# Patient Record
Sex: Female | Born: 1955
Health system: Southern US, Community
[De-identification: ages and names within clinical notes are randomized; demographics above are authoritative.]

## PROBLEM LIST (undated history)

## (undated) DIAGNOSIS — I2510 Atherosclerotic heart disease of native coronary artery without angina pectoris: Secondary | ICD-10-CM

## (undated) DIAGNOSIS — E785 Hyperlipidemia, unspecified: Secondary | ICD-10-CM

## (undated) DIAGNOSIS — I214 Non-ST elevation (NSTEMI) myocardial infarction: Secondary | ICD-10-CM

## (undated) DIAGNOSIS — Z9989 Dependence on other enabling machines and devices: Secondary | ICD-10-CM

## (undated) DIAGNOSIS — Z9981 Dependence on supplemental oxygen: Secondary | ICD-10-CM

## (undated) DIAGNOSIS — I10 Essential (primary) hypertension: Secondary | ICD-10-CM

## (undated) DIAGNOSIS — R569 Unspecified convulsions: Secondary | ICD-10-CM

## (undated) DIAGNOSIS — I272 Pulmonary hypertension, unspecified: Secondary | ICD-10-CM

## (undated) DIAGNOSIS — G4733 Obstructive sleep apnea (adult) (pediatric): Secondary | ICD-10-CM

## (undated) DIAGNOSIS — J449 Chronic obstructive pulmonary disease, unspecified: Secondary | ICD-10-CM

## (undated) DIAGNOSIS — I509 Heart failure, unspecified: Secondary | ICD-10-CM

## (undated) DIAGNOSIS — I639 Cerebral infarction, unspecified: Secondary | ICD-10-CM

## (undated) HISTORY — DX: Unspecified convulsions: R56.9

## (undated) HISTORY — DX: Dependence on other enabling machines and devices: Z99.89

## (undated) HISTORY — DX: Dependence on supplemental oxygen: Z99.81

## (undated) HISTORY — PX: CARDIAC CATHETERIZATION: SHX172

## (undated) HISTORY — DX: Heart failure, unspecified: I50.9

## (undated) HISTORY — DX: Hyperlipidemia, unspecified: E78.5

## (undated) HISTORY — PX: TUBAL LIGATION: SHX77

## (undated) HISTORY — DX: Non-ST elevation (NSTEMI) myocardial infarction: I21.4

## (undated) HISTORY — DX: Cerebral infarction, unspecified: I63.9

## (undated) HISTORY — DX: Chronic obstructive pulmonary disease, unspecified: J44.9

## (undated) HISTORY — DX: Atherosclerotic heart disease of native coronary artery without angina pectoris: I25.10

## (undated) HISTORY — DX: Obstructive sleep apnea (adult) (pediatric): G47.33

## (undated) HISTORY — DX: Pulmonary hypertension, unspecified: I27.20

## (undated) HISTORY — DX: Essential (primary) hypertension: I10

## (undated) HISTORY — PX: MASTECTOMY: SHX3

---

## 2019-09-28 DIAGNOSIS — I214 Non-ST elevation (NSTEMI) myocardial infarction: Secondary | ICD-10-CM | POA: Insufficient documentation

## 2019-09-28 DIAGNOSIS — R4182 Altered mental status, unspecified: Secondary | ICD-10-CM | POA: Insufficient documentation

## 2019-09-28 DIAGNOSIS — I161 Hypertensive emergency: Secondary | ICD-10-CM | POA: Insufficient documentation

## 2019-09-28 DIAGNOSIS — R569 Unspecified convulsions: Secondary | ICD-10-CM | POA: Insufficient documentation

## 2019-12-03 ENCOUNTER — Ambulatory Visit: Payer: Self-pay | Attending: Internal Medicine

## 2019-12-03 DIAGNOSIS — Z23 Encounter for immunization: Secondary | ICD-10-CM

## 2019-12-03 NOTE — Progress Notes (Signed)
   Covid-19 Vaccination Clinic  Name:  Laurie Wall    MRN: [id number] DOB: [birth year]  12/03/2019  Laurie Wall was observed post Covid-19 immunization for 15 minutes without incident. She was provided with Vaccine Information Sheet and instruction to access the V-Safe system.   Laurie Wall was instructed to call 911 with any severe reactions post vaccine: Marland Kitchen Difficulty breathing  . Swelling of face and throat  . A fast heartbeat  . A bad rash all over body  . Dizziness and weakness   Immunizations Administered    Name Date Dose VIS Date Route   Pfizer COVID-19 Vaccine 12/03/2019  1:12 PM 0.3 mL 08/17/2019 Intramuscular   Manufacturer: [employer], [employer]   Lot: [id number]   NDC: [address]-3

## 2019-12-26 ENCOUNTER — Ambulatory Visit: Payer: Self-pay | Attending: Internal Medicine

## 2019-12-26 DIAGNOSIS — Z23 Encounter for immunization: Secondary | ICD-10-CM

## 2019-12-26 NOTE — Progress Notes (Signed)
   Covid-19 Vaccination Clinic  Name:  Laurie Wall    MRN: [id number] DOB: 01-Oct-1955  12/26/2019  [name] was observed post Covid-19 immunization for 15 minutes without incident. She was provided with Vaccine Information Sheet and instruction to access the V-Safe system.   [name] was instructed to call 911 with any severe reactions post vaccine: Marland Kitchen Difficulty breathing  . Swelling of face and throat  . A fast heartbeat  . A bad rash all over body  . Dizziness and weakness   Immunizations Administered    Name Date Dose VIS Date Route   Pfizer COVID-19 Vaccine 12/26/2019 10:02 AM 0.3 mL 10/31/2018 Intramuscular   Manufacturer: [employer], [employer]   Lot: [id number]   NDC: [address]-0

## 2020-11-18 ENCOUNTER — Other Ambulatory Visit: Payer: Self-pay

## 2020-11-18 ENCOUNTER — Ambulatory Visit: Payer: Medicaid Other | Admitting: Cardiology

## 2020-11-18 ENCOUNTER — Encounter: Payer: Self-pay | Admitting: Cardiology

## 2020-11-18 VITALS — BP 165/64 | HR 58 | Temp 98.4°F | Resp 16 | Ht 62.0 in | Wt 255.0 lb

## 2020-11-18 DIAGNOSIS — Z8673 Personal history of transient ischemic attack (TIA), and cerebral infarction without residual deficits: Secondary | ICD-10-CM

## 2020-11-18 DIAGNOSIS — I10 Essential (primary) hypertension: Secondary | ICD-10-CM

## 2020-11-18 DIAGNOSIS — G4733 Obstructive sleep apnea (adult) (pediatric): Secondary | ICD-10-CM

## 2020-11-18 DIAGNOSIS — J449 Chronic obstructive pulmonary disease, unspecified: Secondary | ICD-10-CM

## 2020-11-18 DIAGNOSIS — I252 Old myocardial infarction: Secondary | ICD-10-CM

## 2020-11-18 DIAGNOSIS — E782 Mixed hyperlipidemia: Secondary | ICD-10-CM

## 2020-11-18 DIAGNOSIS — Z9981 Dependence on supplemental oxygen: Secondary | ICD-10-CM

## 2020-11-18 DIAGNOSIS — I5032 Chronic diastolic (congestive) heart failure: Secondary | ICD-10-CM

## 2020-11-18 DIAGNOSIS — Z8249 Family history of ischemic heart disease and other diseases of the circulatory system: Secondary | ICD-10-CM

## 2020-11-18 DIAGNOSIS — I2510 Atherosclerotic heart disease of native coronary artery without angina pectoris: Secondary | ICD-10-CM

## 2020-11-18 NOTE — Progress Notes (Signed)
Date:  11/18/2020   ID:  Laurie Wall, DOB 04/10/1956, MRN [id number]  PCP: Wenda Low, MD  Cardiologist:  Rex Kras, DO, [facility] (established care 11/18/2020) Former Cardiology Providers: Caren Hazy MD, Dr. Elenore Paddy    REASON FOR CONSULT: Non-ST elevation (NSTEMI) myocardial infarction, CAD, [place]  REQUESTING PHYSICIAN:  Noralee Stain, FNP [address] [place],  [place] [address]  Chief Complaint  Patient presents with  . New Patient (Initial Visit)  . Establish Care    History of coronary artery disease, stroke, pulmonary hypertension    HPI  Laurie Wall is a 65 y.o. female who presents to the office with a chief complaint of " reestablish care with cardiology." Patient's past medical history and cardiovascular risk factors include: History of non-STEMI, history of stroke, heart failure with preserved EF, three-vessel coronary artery disease, hyperlipidemia, hypertension, OSA on CPAP, pulmonary hypertension, COPD with oxygen dependence 2 L, postmenopausal female, advanced age, obesity.  She is referred to the office at the request of Marvis Repress, FNP to reestablish care with cardiology after moving to [facility] from [place], [place] given her history of non-STEMI, CAD, stroke, pulmonary hypertension.  Patient is accompanied by her sister [name] who can be reached at [phone].  Patient provides verbal consent in regards to discussing her medical information in her presence.  Patient used to live in [facility] and is now currently in the transition of moving to [place] to be closer to family and is referred to cardiology for reestablishing care given her complex past medical history as outlined above.  It seems of the patient had a stroke in January 2021 and in route to the hospital she had a seizure.  While she was hospitalized at [facility] she was diagnosed with non-STEMI and underwent a coronary catheterization and was found to have  multivessel CAD.  Due to the recent stroke that shared decision at that time was to treat her medically with close follow-up under the care of Dr. Aurea Graff.  Currently patient denies any chest pain or shortness of breath at rest or with effort related activities.  She recently had blood work done at her PCPs office which were reviewed via care everywhere and noted below for further reference.  Patient's blood pressure at today's office visit is not well controlled.  However, she states that her home blood pressures are usually between 120-130 mmHg.  She recently had gone to her primary care doctor and was noted to have a low heart rate.  As per Care Everywhere EKG noted to have ventricular rate of 49 bpm.  Therefore, the dose of her carvedilol was reduced to 6.25 mg p.o. twice daily she was asked to follow-up with cardiology for further recommendations.  Family history of premature coronary disease but no sudden cardiac death.  Brother had a myocardial infarction at the age of 68.  FUNCTIONAL STATUS: No structured exercise program or daily routine.   ALLERGIES: No Known Allergies  MEDICATION LIST PRIOR TO VISIT: Current Meds  Medication Sig  . amLODipine (NORVASC) 10 MG tablet Take 10 mg by mouth daily.  Marland Kitchen aspirin 81 MG chewable tablet 1 tablet  . atorvastatin (LIPITOR) 80 MG tablet Take 1 tablet by mouth daily.  . carvedilol (COREG) 6.25 MG tablet Take 1 tablet by mouth 2 (two) times daily with a meal.  . chlorthalidone (HYGROTON) 25 MG tablet TAKE 1/2 TABLET BY MOUTH EVERY DAY IN THE MORNING WITH FOOD  . Cholecalciferol 25 MCG (1000  UT) tablet Take by mouth.  . cyanocobalamin 100 MCG tablet Take by mouth.  . Ferrous Sulfate (IRON) 325 (65 Fe) MG TABS 1 tablet  . Fluticasone-Umeclidin-Vilant (TRELEGY ELLIPTA) 100-62.5-25 MCG/INH AEPB 1 puff  . furosemide (LASIX) 20 MG tablet Take 20 mg by mouth daily.  Marland Kitchen gabapentin (NEURONTIN) 100 MG capsule 2 at 7 pm  . levETIRAcetam (KEPPRA) 1000 MG  tablet 1 q am and 1/2 q pm  . omeprazole (PRILOSEC) 20 MG capsule 1 capsule  . Pyridoxine HCl (VITAMIN B6) 100 MG TABS 1 tablet     PAST MEDICAL HISTORY: Past Medical History:  Diagnosis Date  . CHF (congestive heart failure) ([place])   . COPD (chronic obstructive pulmonary disease) ([place])   . Coronary artery disease   . Hyperlipidemia   . Hypertension   . NSTEMI (non-ST elevated myocardial infarction) ([place])   . OSA on CPAP   . Oxygen dependent   . Pulmonary hypertension ([place])   . Seizure ([place])   . Stroke [facility])     PAST SURGICAL HISTORY: Past Surgical History:  Procedure Laterality Date  . CARDIAC CATHETERIZATION    . MASTECTOMY Bilateral   . TUBAL LIGATION      FAMILY HISTORY: The patient family history includes Asthma in her sister; Heart attack in her father and mother; Hyperlipidemia in her sister and sister; Hypertension in her brother.  SOCIAL HISTORY:  The patient  reports that she quit smoking about 13 months ago. Her smoking use included cigarettes. She has a 30.00 pack-year smoking history. She has never used smokeless tobacco. She reports that she does not drink alcohol and does not use drugs.  REVIEW OF SYSTEMS: Review of Systems  Constitutional: Negative for chills and fever.  HENT: Negative for hoarse voice and nosebleeds.   Eyes: Negative for discharge, double vision and pain.  Cardiovascular: Positive for leg swelling and orthopnea. Negative for chest pain, claudication, dyspnea on exertion, near-syncope, palpitations, paroxysmal nocturnal dyspnea and syncope.  Respiratory: Negative for hemoptysis and shortness of breath.   Musculoskeletal: Negative for muscle cramps and myalgias.  Gastrointestinal: Negative for abdominal pain, constipation, diarrhea, hematemesis, hematochezia, melena, nausea and vomiting.  Neurological: Positive for seizures. Negative for dizziness and light-headedness.       Speaking difficulties (expressive aphasia)    PHYSICAL  EXAM: Vitals with BMI 11/18/2020  Height 5' 2"   Weight 255 lbs  BMI 01.77  Systolic 939  Diastolic 64  Pulse 58    CONSTITUTIONAL: Appears older than stated age, hemodynamically stable, no acute distress.    SKIN: Skin is warm and dry. No rash noted. No cyanosis. No pallor. No jaundice HEAD: Normocephalic and atraumatic.  EYES: No scleral icterus MOUTH/THROAT: Moist oral membranes.  NECK: No JVD present. No thyromegaly noted. No carotid bruits  LYMPHATIC: No visible cervical adenopathy.  CHEST Normal respiratory effort. No intercostal retractions  LUNGS: Clear to auscultation bilaterally.  No stridor. No wheezes. No rales.  CARDIOVASCULAR: Regular, positive S1-S2, no murmurs rubs or gallops appreciated. ABDOMINAL: Obese, soft, nontender, nondistended, positive bowel sounds in all 4 quadrants, no apparent ascites.  EXTREMITIES: Trace bilateral peripheral edema, darker skin pigmentation to suggest chronic venous insufficiency, decreased bilateral DP and PT pulses. HEMATOLOGIC: No significant bruising NEUROLOGIC: Oriented to person, place, and time. Nonfocal. Normal muscle tone.  PSYCHIATRIC: Normal mood and affect. Normal behavior. Cooperative  CARDIAC DATABASE: EKG: 11/18/2020: Sinus bradycardia, 52 bpm, normal axis, old anteroseptal infarct, ST-T changes in inferolateral leads suggestive of possible ischemia, without underlying injury pattern.  Echocardiogram: [facility] health care 04/25/2020:  1. The left ventricle is normal in size with normal wall thickness.  2. The left ventricular systolic function is normal, LVEF is visually estimated at 55-60%.  3. There is grade II diastolic dysfunction (elevated filling pressure).  4. The left atrium is mildly dilated in size.  5. The right ventricle is normal in size, with normal systolic function.  6. There is moderate pulmonary hypertension, estimated pulmonary artery systolic pressure is 56 mmHg.  7. The right atrium is mildly  dilated in size.   Stress Testing: No results found for this or any previous visit from the past 1095 days.  Heart Catheterization: [facility] 10/04/2019: 1. Normal LV filling pressures with mean PCWP of 8 mm Hg and LVEDP of 11 mm Hg  2. Normal LV systolic function  3. No significant renal artery stenosis  4. Multivessel coronary artery disease including 80% proximal LADstenosis, 60% mid-circumflex stenosis, 50% OM1 stenosis, 60% OM2 stenosis and 95% mid-RCA stenosis  5. Pulmonary hypertension with PA pressure of 56/19 mm Hg with a mean of 32 mm Hg   LABORATORY DATA: No flowsheet data found.  No flowsheet data found.  Lipid Panel  No results found for: CHOL, TRIG, HDL, CHOLHDL, VLDL, LDLCALC, LDLDIRECT, LABVLDL  No components found for: NTPROBNP No results for input(s): PROBNP in the last 8760 hours. No results for input(s): TSH in the last 8760 hours.  BMP No results for input(s): NA, K, CL, CO2, GLUCOSE, BUN, CREATININE, CALCIUM, GFRNONAA, GFRAA in the last 8760 hours.  HEMOGLOBIN A1C No results found for: HGBA1C, MPG  External Labs: Collected: 11/13/2020 provided from care everywhere [facility] clinic) Hemoglobin 12.9 g/dL, hematocrit 40.5% NT proBNP: 194 Creatinine 1 mg/dL. eGFR: 62 mL/min per 1.73 m Sodium 141, potassium 3.5, chloride 97, bicarb 35, AST 16, ALT 18, alkaline phosphatase 127 Lipid profile: Total cholesterol 154, triglycerides 75, HDL 57, LDL 82.  Hemoglobin A1c: 6.1 TSH: 1.53  IMPRESSION:    ICD-10-CM   1. Atherosclerosis of native coronary artery of native heart without angina pectoris  I25.10   2. History of non-ST elevation myocardial infarction (NSTEMI)  I25.2 EKG 12-Lead  3. Chronic heart failure with preserved ejection fraction (HCC)  I50.32   4. History of stroke  Z86.73   5. Benign hypertension  I10   6. Mixed hyperlipidemia  E78.2   7. OSA on CPAP  G47.33    Z99.89   8. Chronic obstructive pulmonary disease, unspecified COPD  type ([place])  J44.9   9. Oxygen dependent  Z99.81   10. Family history of premature CAD  Z82.49   63. Class 3 severe obesity due to excess calories with serious comorbidity and body mass index (BMI) of 45.0 to 49.9 in adult [facility])  E66.01    Z68.42      RECOMMENDATIONS: Aziza Stuckert is a 66 y.o. female whose past medical history and cardiac risk factors include: History of non-STEMI, history of stroke, heart failure with preserved EF, three-vessel coronary artery disease, hyperlipidemia, hypertension, OSA on CPAP, pulmonary hypertension, COPD with oxygen dependence 2 L, postmenopausal female, advanced age, obesity.  Establish coronary artery disease without angina pectoris: During her hospitalization in January 2021 patient was diagnosed with non-STEMI as well as HFpEF.  Due to the recent stroke patient's sister states that the shared decision was to treat her medically with close follow-up. Patient remains asymptomatic. Was unable to reconcile her medications accurately as she did not bring a list of her medications  or her medication bottles at today's office visit. Patient is educated on the importance of improving her modifiable cardiovascular risk factors. Reviewed the most recent echocardiogram and left heart catheterization reports that are available in Care Everywhere during today's office encounter and findings summarized above. Recommended continuing the current dose of carvedilol.  History of non-STEMI: Continue aspirin and statin therapy.  Continue carvedilol 6.25 mg p.o. twice daily.  Chronic heart failure with preserved EF, stage C, NYHA class II: Overall euvolemic. Most recent NT proBNP within normal limits. Once her medications are better reconcile we will consider discontinuing chlorthalidone and/or Lasix and transitioning her to [facility] given her HFpEF, and structural heart disease noted on the most recent echocardiogram. Further recommendations to follow  Benign  essential hypertension: Office blood pressures not well controlled. However, patient states that her home blood pressures are well controlled. I have asked her to keep a log of her blood pressures and to bring it in at the next office visit. Low-salt diet recommended, discussed DASH diet. Encourage medication compliance.  Hyperlipidemia: Continue statin therapy.  Most recent lipid profile independently reviewed and findings noted above.  History of stroke: Patient has developed slurred speech status post stroke. Educated on the importance of secondary prevention. Continue aspirin and statin therapy. Most recent hemoglobin A1c consistent with prediabetes.  Patient educated on the importance of glycemic control. Most recent lipid profile reviewed. Encouraged her regarding the importance of blood pressure monitoring and medication compliance.  COPD on 2 L nasal cannula: I have asked the patient to consider seeing pulmonary medicine to reestablish care given her underlying COPD and sleep apnea.  Will defer further management to primary/pulmonary medicine.   FINAL MEDICATION LIST END OF ENCOUNTER: No orders of the defined types were placed in this encounter.   There are no discontinued medications.   Current Outpatient Medications:  .  amLODipine (NORVASC) 10 MG tablet, Take 10 mg by mouth daily., Disp: , Rfl:  .  aspirin 81 MG chewable tablet, 1 tablet, Disp: , Rfl:  .  atorvastatin (LIPITOR) 80 MG tablet, Take 1 tablet by mouth daily., Disp: , Rfl:  .  carvedilol (COREG) 6.25 MG tablet, Take 1 tablet by mouth 2 (two) times daily with a meal., Disp: , Rfl:  .  chlorthalidone (HYGROTON) 25 MG tablet, TAKE 1/2 TABLET BY MOUTH EVERY DAY IN THE MORNING WITH FOOD, Disp: , Rfl:  .  Cholecalciferol 25 MCG (1000 UT) tablet, Take by mouth., Disp: , Rfl:  .  cyanocobalamin 100 MCG tablet, Take by mouth., Disp: , Rfl:  .  Ferrous Sulfate (IRON) 325 (65 Fe) MG TABS, 1 tablet, Disp: , Rfl:  .   Fluticasone-Umeclidin-Vilant (TRELEGY ELLIPTA) 100-62.5-25 MCG/INH AEPB, 1 puff, Disp: , Rfl:  .  furosemide (LASIX) 20 MG tablet, Take 20 mg by mouth daily., Disp: , Rfl:  .  gabapentin (NEURONTIN) 100 MG capsule, 2 at 7 pm, Disp: , Rfl:  .  levETIRAcetam (KEPPRA) 1000 MG tablet, 1 q am and 1/2 q pm, Disp: , Rfl:  .  omeprazole (PRILOSEC) 20 MG capsule, 1 capsule, Disp: , Rfl:  .  Pyridoxine HCl (VITAMIN B6) 100 MG TABS, 1 tablet, Disp: , Rfl:   Orders Placed This Encounter  Procedures  . EKG 12-Lead    There are no Patient Instructions on file for this visit.   --Continue cardiac medications as reconciled in final medication list. --Return in about 4 weeks (around 12/16/2020) for Follow up, CAD. Or sooner if needed. --Continue follow-up  with your primary care physician regarding the management of your other chronic comorbid conditions.  Patient's questions and concerns were addressed to her satisfaction. She voices understanding of the instructions provided during this encounter.   This note was created using a voice recognition software as a result there may be grammatical errors inadvertently enclosed that do not reflect the nature of this encounter. Every attempt is made to correct such errors.  Total encounter time 72 minutes. *Total Encounter Time as defined by the Centers for Medicare and Medicaid Services includes, in addition to the face-to-face time of a patient visit (documented in the note above) non-face-to-face time: obtaining and reviewing outside history, ordering and reviewing medications, tests or procedures, obtaining history of present illness from her sister, care coordination (communications with other health care professionals or caregivers) and documentation in the medical record.   Rex Kras, [place], [facility]  Pager: [phone] Office: [phone]

## 2020-12-03 ENCOUNTER — Telehealth: Payer: Self-pay

## 2020-12-12 NOTE — Telephone Encounter (Signed)
error 

## 2020-12-23 ENCOUNTER — Other Ambulatory Visit: Payer: Self-pay

## 2020-12-23 ENCOUNTER — Other Ambulatory Visit: Payer: Self-pay | Admitting: Cardiology

## 2020-12-23 ENCOUNTER — Encounter: Payer: Self-pay | Admitting: Cardiology

## 2020-12-23 ENCOUNTER — Ambulatory Visit: Payer: Medicare Other | Admitting: Cardiology

## 2020-12-23 VITALS — BP 149/70 | HR 62 | Temp 97.2°F | Resp 16 | Ht 62.0 in | Wt 260.4 lb

## 2020-12-23 DIAGNOSIS — I10 Essential (primary) hypertension: Secondary | ICD-10-CM

## 2020-12-23 DIAGNOSIS — E782 Mixed hyperlipidemia: Secondary | ICD-10-CM | POA: Diagnosis not present

## 2020-12-23 DIAGNOSIS — I2510 Atherosclerotic heart disease of native coronary artery without angina pectoris: Secondary | ICD-10-CM

## 2020-12-23 DIAGNOSIS — I252 Old myocardial infarction: Secondary | ICD-10-CM

## 2020-12-23 DIAGNOSIS — I5032 Chronic diastolic (congestive) heart failure: Secondary | ICD-10-CM

## 2020-12-23 DIAGNOSIS — Z9989 Dependence on other enabling machines and devices: Secondary | ICD-10-CM | POA: Diagnosis not present

## 2020-12-23 DIAGNOSIS — Z8249 Family history of ischemic heart disease and other diseases of the circulatory system: Secondary | ICD-10-CM | POA: Diagnosis not present

## 2020-12-23 DIAGNOSIS — J449 Chronic obstructive pulmonary disease, unspecified: Secondary | ICD-10-CM

## 2020-12-23 DIAGNOSIS — G4733 Obstructive sleep apnea (adult) (pediatric): Secondary | ICD-10-CM | POA: Diagnosis not present

## 2020-12-23 DIAGNOSIS — Z8673 Personal history of transient ischemic attack (TIA), and cerebral infarction without residual deficits: Secondary | ICD-10-CM

## 2020-12-23 DIAGNOSIS — I272 Pulmonary hypertension, unspecified: Secondary | ICD-10-CM | POA: Diagnosis not present

## 2020-12-23 DIAGNOSIS — Z9981 Dependence on supplemental oxygen: Secondary | ICD-10-CM

## 2020-12-23 MED ORDER — CARVEDILOL 6.25 MG PO TABS: 6.2500 mg | ORAL_TABLET | Freq: Two times a day (BID) | ORAL | 0 refills | Status: DC

## 2020-12-23 MED ORDER — ENTRESTO 24-26 MG PO TABS: 1.0000 | ORAL_TABLET | Freq: Two times a day (BID) | ORAL | 0 refills | Status: DC

## 2020-12-23 MED ORDER — NITROGLYCERIN 0.4 MG SL SUBL: 0.4000 mg | SUBLINGUAL_TABLET | SUBLINGUAL | 0 refills | Status: AC | PRN

## 2020-12-23 NOTE — Progress Notes (Signed)
ID:  Laurie Wall, DOB 30-Sep-1955, MRN [id number]  PCP: Wenda Low, MD  Cardiologist:  Rex Kras, DO, [facility] (established care 11/18/2020) Former Cardiology Providers: Caren Hazy MD, Dr. Elenore Paddy    Date: 12/23/2020 Last Office Visit: 11/18/2020  Chief Complaint  Patient presents with  . Follow-up  . Coronary Artery Disease    HPI  Laurie Wall is a 65 y.o. female who presents to the office with a chief complaint of " management of CAD." Patient's past medical history and cardiovascular risk factors include: History of non-STEMI, history of stroke, heart failure with preserved EF, three-vessel coronary artery disease, hyperlipidemia, hypertension, OSA on CPAP, pulmonary hypertension, COPD with oxygen dependence 2 L, postmenopausal female, advanced age, obesity.  She is referred to the office at the request of Laurie Repress, FNP to reestablish care with cardiology after moving to [facility] from [place], [place] given her history of non-STEMI, CAD, stroke, pulmonary hypertension.  Patient is accompanied by her sister [name] who can be reached at [phone].  Patient provides verbal consent in regards to discussing her medical information in her presence.  Patient used to live in [facility] and is now currently in the transition of moving to [place] to be closer to family and is referred to cardiology for reestablishing care given her complex past medical history as outlined above.  Patient had a stroke in January 2021 and in route to the hospital she had a seizure.  While she was hospitalized at [facility] she was diagnosed with non-STEMI and underwent a coronary catheterization and was found to have multivessel CAD.  Due to the recent stroke that shared decision at that time was to treat her medically with close follow-up under the care of Dr. Aurea Graff.  Since relocated from establish care with our practice as of March 2022.  She denies chest pain or  anginal equivalent.  Medications were titrated last office visit and was asked to follow-up in 1 month to reevaluate symptoms and further medication titration.  After reducing the dose of carvedilol patient states that she is not feeling tired, fatigued.  Her home blood pressures range between 120-140 mmHg and pulse is between 50-60 bpm.  No hospitalizations or urgent care visits for cardiovascular symptoms.  Family history of premature coronary disease but no sudden cardiac death.  Brother had a myocardial infarction at the age of 30.  FUNCTIONAL STATUS: No structured exercise program or daily routine.   ALLERGIES: No Known Allergies  MEDICATION LIST PRIOR TO VISIT: Current Meds  Medication Sig  . amLODipine (NORVASC) 10 MG tablet Take 10 mg by mouth daily.  Marland Kitchen aspirin 81 MG chewable tablet 1 tablet  . atorvastatin (LIPITOR) 80 MG tablet Take 1 tablet by mouth daily.  . Cholecalciferol 25 MCG (1000 UT) tablet Take by mouth.  . cyanocobalamin 100 MCG tablet Take by mouth.  . Ferrous Sulfate (IRON) 325 (65 Fe) MG TABS 1 tablet  . Fluticasone-Umeclidin-Vilant (TRELEGY ELLIPTA) 100-62.5-25 MCG/INH AEPB 1 puff  . gabapentin (NEURONTIN) 100 MG capsule 2 at 7 pm  . levETIRAcetam (KEPPRA) 1000 MG tablet 1 q am and 1/2 q pm  . nitroGLYCERIN (NITROSTAT) 0.4 MG SL tablet Place 1 tablet (0.4 mg total) under the tongue every 5 (five) minutes as needed for chest pain. If you require more than two tablets five minutes apart go to the nearest ER via EMS.  Marland Kitchen omeprazole (PRILOSEC) 20 MG capsule 1 capsule  . Pyridoxine HCl (VITAMIN B6) 100 MG TABS 1  tablet  . sacubitril-valsartan (ENTRESTO) 24-26 MG Take 1 tablet by mouth 2 (two) times daily.  . [DISCONTINUED] carvedilol (COREG) 6.25 MG tablet Take 1 tablet by mouth 2 (two) times daily with a meal.  . [DISCONTINUED] chlorthalidone (HYGROTON) 25 MG tablet TAKE 1/2 TABLET BY MOUTH EVERY DAY IN THE MORNING WITH FOOD  . [DISCONTINUED] furosemide (LASIX) 20 MG  tablet Take 20 mg by mouth daily.     PAST MEDICAL HISTORY: Past Medical History:  Diagnosis Date  . CHF (congestive heart failure) ([place])   . COPD (chronic obstructive pulmonary disease) ([place])   . Coronary artery disease   . Hyperlipidemia   . Hypertension   . NSTEMI (non-ST elevated myocardial infarction) ([place])   . OSA on CPAP   . Oxygen dependent   . Pulmonary hypertension ([place])   . Seizure ([place])   . Stroke [facility])     PAST SURGICAL HISTORY: Past Surgical History:  Procedure Laterality Date  . CARDIAC CATHETERIZATION    . MASTECTOMY Bilateral   . TUBAL LIGATION      FAMILY HISTORY: The patient family history includes Asthma in her sister; Heart attack in her father and mother; Hyperlipidemia in her sister and sister; Hypertension in her brother.  SOCIAL HISTORY:  The patient  reports that she quit smoking about 15 months ago. Her smoking use included cigarettes. She has a 30.00 pack-year smoking history. She has never used smokeless tobacco. She reports that she does not drink alcohol and does not use drugs.  REVIEW OF SYSTEMS: Review of Systems  Constitutional: Negative for chills and fever.  HENT: Negative for hoarse voice and nosebleeds.   Eyes: Negative for discharge, double vision and pain.  Cardiovascular: Negative for chest pain, claudication, dyspnea on exertion, leg swelling, near-syncope, orthopnea, palpitations, paroxysmal nocturnal dyspnea and syncope.  Respiratory: Negative for hemoptysis and shortness of breath.   Musculoskeletal: Negative for muscle cramps and myalgias.  Gastrointestinal: Negative for abdominal pain, constipation, diarrhea, hematemesis, hematochezia, melena, nausea and vomiting.  Neurological: Positive for seizures. Negative for dizziness and light-headedness.       Speaking difficulties (expressive aphasia)    PHYSICAL EXAM: Vitals with BMI 12/23/2020 11/18/2020  Height 5' 2"  5' 2"   Weight 260 lbs 6 oz 255 lbs  BMI 60.73 71.06   Systolic 269 485  Diastolic 70 64  Pulse 62 58    CONSTITUTIONAL: Appears older than stated age, hemodynamically stable, no acute distress.    SKIN: Skin is warm and dry. No rash noted. No cyanosis. No pallor. No jaundice HEAD: Normocephalic and atraumatic.  EYES: No scleral icterus MOUTH/THROAT: Moist oral membranes.  NECK: No JVD present. No thyromegaly noted. No carotid bruits  LYMPHATIC: No visible cervical adenopathy.  CHEST Normal respiratory effort. No intercostal retractions  LUNGS: Clear to auscultation bilaterally.  No stridor. No wheezes. No rales.  CARDIOVASCULAR: Regular, positive S1-S2, no murmurs rubs or gallops appreciated. ABDOMINAL: Obese, soft, nontender, nondistended, positive bowel sounds in all 4 quadrants, no apparent ascites.  EXTREMITIES: Trace bilateral peripheral edema, darker skin pigmentation to suggest chronic venous insufficiency, decreased bilateral DP and PT pulses. HEMATOLOGIC: No significant bruising NEUROLOGIC: Oriented to person, place, and time. Nonfocal. Normal muscle tone.  Walks with a walker. PSYCHIATRIC: Normal mood and affect. Normal behavior. Cooperative  CARDIAC DATABASE: EKG: 11/18/2020: Sinus bradycardia, 52 bpm, normal axis, old anteroseptal infarct, ST-T changes in inferolateral leads suggestive of possible ischemia, without underlying injury pattern.  Echocardiogram: [facility] care 04/25/2020:  1. The left ventricle is  normal in size with normal wall thickness.  2. The left ventricular systolic function is normal, LVEF is visually estimated at 55-60%.  3. There is grade II diastolic dysfunction (elevated filling pressure).  4. The left atrium is mildly dilated in size.  5. The right ventricle is normal in size, with normal systolic function.  6. There is moderate pulmonary hypertension, estimated pulmonary artery systolic pressure is 56 mmHg.  7. The right atrium is mildly dilated in size.   Stress Testing: No  results found for this or any previous visit from the past 1095 days.  Heart Catheterization: [facility] 10/04/2019: 1. Normal LV filling pressures with mean PCWP of 8 mm Hg and LVEDP of 11 mm Hg  2. Normal LV systolic function  3. No significant renal artery stenosis  4. Multivessel coronary artery disease including 80% proximal LADstenosis, 60% mid-circumflex stenosis, 50% OM1 stenosis, 60% OM2 stenosis and 95% mid-RCA stenosis  5. Pulmonary hypertension with PA pressure of 56/19 mm Hg with a mean of 32 mm Hg   LABORATORY DATA: No flowsheet data found.  No flowsheet data found.  Lipid Panel  No results found for: CHOL, TRIG, HDL, CHOLHDL, VLDL, LDLCALC, LDLDIRECT, LABVLDL  No components found for: NTPROBNP No results for input(s): PROBNP in the last 8760 hours. No results for input(s): TSH in the last 8760 hours.  BMP No results for input(s): NA, K, CL, CO2, GLUCOSE, BUN, CREATININE, CALCIUM, GFRNONAA, GFRAA in the last 8760 hours.  HEMOGLOBIN A1C No results found for: HGBA1C, MPG  External Labs: Collected: 11/13/2020 provided from care everywhere [facility] clinic) Hemoglobin 12.9 g/dL, hematocrit 40.5% NT proBNP: 194 Creatinine 1 mg/dL. eGFR: 62 mL/min per 1.73 m Sodium 141, potassium 3.5, chloride 97, bicarb 35, AST 16, ALT 18, alkaline phosphatase 127 Lipid profile: Total cholesterol 154, triglycerides 75, HDL 57, LDL 82.  Hemoglobin A1c: 6.1 TSH: 1.53  IMPRESSION:    ICD-10-CM   1. Atherosclerosis of native coronary artery of native heart without angina pectoris  I25.10 PCV ECHOCARDIOGRAM COMPLETE    nitroGLYCERIN (NITROSTAT) 0.4 MG SL tablet    DISCONTINUED: carvedilol (COREG) 6.25 MG tablet  2. History of non-ST elevation myocardial infarction (NSTEMI)  I25.2 nitroGLYCERIN (NITROSTAT) 0.4 MG SL tablet  3. Chronic heart failure with preserved ejection fraction (HCC)  I50.32 sacubitril-valsartan (ENTRESTO) 24-26 MG    PCV ECHOCARDIOGRAM COMPLETE    Basic  metabolic panel    Magnesium    Pro b natriuretic peptide (BNP)    DISCONTINUED: carvedilol (COREG) 6.25 MG tablet  4. History of TIA (transient ischemic attack)  Z86.73   5. Benign hypertension  I10 DISCONTINUED: carvedilol (COREG) 6.25 MG tablet  6. Mixed hyperlipidemia  E78.2   7. OSA on CPAP  G47.33    Z99.89   8. Chronic obstructive pulmonary disease, unspecified COPD type ([place])  J44.9   9. Oxygen dependent  Z99.81   10. Family history of premature CAD  Z82.49      RECOMMENDATIONS: Laiken Sandy is a 65 y.o. female whose past medical history and cardiac risk factors include: History of non-STEMI, history of stroke, heart failure with preserved EF, three-vessel coronary artery disease, hyperlipidemia, hypertension, OSA on CPAP, pulmonary hypertension, COPD with oxygen dependence 2 L, postmenopausal female, advanced age, obesity.  Establish coronary artery disease without angina pectoris:  During her hospitalization in January 2021 patient was diagnosed with non-STEMI, multivessel CAD, HFpEF.  Due to the recent stroke patient's sister states that the shared decision was to treat  her medically with close follow-up.  Patient remains asymptomatic.  Medications reconciled at today's office visit.  Represcribed carvedilol at 6.25 mg p.o. twice daily.  Start Entresto 24/26 mg p.o. twice daily  Blood work in 1 week to evaluate kidney function and electrolytes.  Echocardiogram will be ordered to evaluate for structural heart disease and left ventricular systolic function.  We discussed undergoing left and right heart catheterization to evaluate for obstructive CAD and pulmonary hypertension.  However, patient states that she is doing well overall and does not want additional invasive cardiovascular testing at this time.  Patient states that she will seek medical attention if she has anginal chest pain by going to the closest ER via EMS.  Will focus on up titration of guideline  directed medical therapy and reevaluate.  Prescribe sublingual nitroglycerin tablets to use on a as needed basis.  Medication profile discussed with both the patient and her sister.  History of non-STEMI: Continue aspirin and statin therapy.  Patient prefers up titration of GDMT as discussed above  Chronic heart failure with preserved EF, stage C, NYHA class II:  Overall euvolemic.  Discontinue chlorthalidone and Lasix  Start Entresto 24/26 mg p.o. twice daily  Blood work in 1 week to evaluate kidney function  Repeat echocardiogram to evaluate LVEF and structural heart disease.  Benign essential hypertension:  Office blood pressures not well controlled.  However, patient states that her home blood pressures are well controlled.  I have asked her to keep a log of her blood pressures and to bring it in at the next office visit.  Low-salt diet recommended, discussed DASH diet.  Encourage medication compliance.   Hyperlipidemia: Continue statin therapy.  Most recent lipid profile independently reviewed and findings noted above.  History of stroke:  Residual deficits include slurred speech.   Educated on the importance of secondary prevention.  Continue aspirin and statin therapy.  Most recent hemoglobin A1c consistent with prediabetes.  Patient educated on the importance of glycemic control.  Most recent lipid profile reviewed.  Encouraged her regarding the importance of blood pressure monitoring and medication compliance.  COPD on 2 L nasal cannula: I have asked the patient to consider seeing pulmonary medicine to reestablish care given her underlying COPD and sleep apnea.  Will defer further management to primary/pulmonary medicine.   FINAL MEDICATION LIST END OF ENCOUNTER: Meds ordered this encounter  Medications  . sacubitril-valsartan (ENTRESTO) 24-26 MG    Sig: Take 1 tablet by mouth 2 (two) times daily.    Dispense:  60 tablet    Refill:  0  . DISCONTD:  carvedilol (COREG) 6.25 MG tablet    Sig: Take 1 tablet (6.25 mg total) by mouth 2 (two) times daily with a meal.    Dispense:  60 tablet    Refill:  0  . nitroGLYCERIN (NITROSTAT) 0.4 MG SL tablet    Sig: Place 1 tablet (0.4 mg total) under the tongue every 5 (five) minutes as needed for chest pain. If you require more than two tablets five minutes apart go to the nearest ER via EMS.    Dispense:  30 tablet    Refill:  0    Medications Discontinued During This Encounter  Medication Reason  . chlorthalidone (HYGROTON) 25 MG tablet Change in therapy  . furosemide (LASIX) 20 MG tablet Change in therapy  . carvedilol (COREG) 6.25 MG tablet Reorder     Current Outpatient Medications:  .  amLODipine (NORVASC) 10 MG tablet, Take 10 mg  by mouth daily., Disp: , Rfl:  .  aspirin 81 MG chewable tablet, 1 tablet, Disp: , Rfl:  .  atorvastatin (LIPITOR) 80 MG tablet, Take 1 tablet by mouth daily., Disp: , Rfl:  .  Cholecalciferol 25 MCG (1000 UT) tablet, Take by mouth., Disp: , Rfl:  .  cyanocobalamin 100 MCG tablet, Take by mouth., Disp: , Rfl:  .  Ferrous Sulfate (IRON) 325 (65 Fe) MG TABS, 1 tablet, Disp: , Rfl:  .  Fluticasone-Umeclidin-Vilant (TRELEGY ELLIPTA) 100-62.5-25 MCG/INH AEPB, 1 puff, Disp: , Rfl:  .  gabapentin (NEURONTIN) 100 MG capsule, 2 at 7 pm, Disp: , Rfl:  .  levETIRAcetam (KEPPRA) 1000 MG tablet, 1 q am and 1/2 q pm, Disp: , Rfl:  .  nitroGLYCERIN (NITROSTAT) 0.4 MG SL tablet, Place 1 tablet (0.4 mg total) under the tongue every 5 (five) minutes as needed for chest pain. If you require more than two tablets five minutes apart go to the nearest ER via EMS., Disp: 30 tablet, Rfl: 0 .  omeprazole (PRILOSEC) 20 MG capsule, 1 capsule, Disp: , Rfl:  .  Pyridoxine HCl (VITAMIN B6) 100 MG TABS, 1 tablet, Disp: , Rfl:  .  sacubitril-valsartan (ENTRESTO) 24-26 MG, Take 1 tablet by mouth 2 (two) times daily., Disp: 60 tablet, Rfl: 0 .  carvedilol (COREG) 6.25 MG tablet, TAKE 1  TABLET(6.25 MG) BY MOUTH TWICE DAILY WITH A MEAL, Disp: 180 tablet, Rfl: 0  Orders Placed This Encounter  Procedures  . Basic metabolic panel  . Magnesium  . Pro b natriuretic peptide (BNP)  . PCV ECHOCARDIOGRAM COMPLETE    There are no Patient Instructions on file for this visit.   --Continue cardiac medications as reconciled in final medication list. --Return in about 4 weeks (around 01/20/2021) for Follow up, heart failure management.. Or sooner if needed. --Continue follow-up with your primary care physician regarding the management of your other chronic comorbid conditions.  Patient's questions and concerns were addressed to her satisfaction. She voices understanding of the instructions provided during this encounter.   This note was created using a voice recognition software as a result there may be grammatical errors inadvertently enclosed that do not reflect the nature of this encounter. Every attempt is made to correct such errors.   Rex Kras, [place], [facility]  Pager: [phone] Office: [phone]

## 2020-12-24 DIAGNOSIS — G4733 Obstructive sleep apnea (adult) (pediatric): Secondary | ICD-10-CM | POA: Diagnosis not present

## 2020-12-30 ENCOUNTER — Other Ambulatory Visit: Payer: Self-pay

## 2020-12-30 ENCOUNTER — Ambulatory Visit: Payer: Medicare Other

## 2020-12-30 DIAGNOSIS — I2510 Atherosclerotic heart disease of native coronary artery without angina pectoris: Secondary | ICD-10-CM | POA: Diagnosis not present

## 2020-12-30 DIAGNOSIS — I5032 Chronic diastolic (congestive) heart failure: Secondary | ICD-10-CM

## 2020-12-31 ENCOUNTER — Other Ambulatory Visit: Payer: Medicare Other

## 2020-12-31 LAB — BASIC METABOLIC PANEL
BUN/Creatinine Ratio: 16 (ref 12–28)
BUN: 15 mg/dL (ref 8–27)
CO2: 29 mmol/L (ref 20–29)
Calcium: 9.5 mg/dL (ref 8.7–10.3)
Chloride: 100 mmol/L (ref 96–106)
Creatinine, Ser: 0.92 mg/dL (ref 0.57–1.00)
Glucose: 82 mg/dL (ref 65–99)
Potassium: 3.9 mmol/L (ref 3.5–5.2)
Sodium: 141 mmol/L (ref 134–144)
eGFR: 69 mL/min/{1.73_m2} (ref >59)

## 2020-12-31 LAB — MAGNESIUM: Magnesium: 1.7 mg/dL (ref 1.6–2.3)

## 2020-12-31 LAB — PRO B NATRIURETIC PEPTIDE: NT-Pro BNP: 161 pg/mL (ref 0–301)

## 2020-12-31 NOTE — Progress Notes (Signed)
No answer left a vm to call back

## 2021-01-05 ENCOUNTER — Other Ambulatory Visit: Payer: Self-pay

## 2021-01-05 ENCOUNTER — Ambulatory Visit (INDEPENDENT_AMBULATORY_CARE_PROVIDER_SITE_OTHER): Payer: Medicare Other | Admitting: Podiatry

## 2021-01-05 ENCOUNTER — Encounter: Payer: Self-pay | Admitting: Podiatry

## 2021-01-05 DIAGNOSIS — M79675 Pain in left toe(s): Secondary | ICD-10-CM | POA: Diagnosis not present

## 2021-01-05 DIAGNOSIS — B351 Tinea unguium: Secondary | ICD-10-CM

## 2021-01-05 DIAGNOSIS — M79674 Pain in right toe(s): Secondary | ICD-10-CM | POA: Diagnosis not present

## 2021-01-05 NOTE — Progress Notes (Signed)
Called and spoke to pts sister regarding lab results. She voiced understanding and will pass along the message.

## 2021-01-05 NOTE — Progress Notes (Signed)
Called and spoke to pts sister regarding echo results. She voiced understanding and will pass along the message.

## 2021-01-05 NOTE — Progress Notes (Signed)
Called pt no answer, left a vm

## 2021-01-08 NOTE — Progress Notes (Signed)
Subjective:   Patient ID: Laurie Wall, female   DOB: 65 y.o.   MRN: [id number]   HPI 65 year old female presents the office today for concerns of thick, discolored toenails that she cannot trim her self.  She states that her nails to get tender at times.  She has a history of a stroke in January 2021 affecting her right lower extremity.  Also she has dry skin but she denies any open lesions.   Review of Systems  All other systems reviewed and are negative.  Past Medical History:  Diagnosis Date  . CHF (congestive heart failure) (HCC)   . COPD (chronic obstructive pulmonary disease) (HCC)   . Coronary artery disease   . Hyperlipidemia   . Hypertension   . NSTEMI (non-ST elevated myocardial infarction) (HCC)   . OSA on CPAP   . Oxygen dependent   . Pulmonary hypertension (HCC)   . Seizure (HCC)   . Stroke [facility])     Past Surgical History:  Procedure Laterality Date  . CARDIAC CATHETERIZATION    . MASTECTOMY Bilateral   . TUBAL LIGATION       Current Outpatient Medications:  .  amLODipine (NORVASC) 10 MG tablet, Take 10 mg by mouth daily., Disp: , Rfl:  .  aspirin 81 MG chewable tablet, 1 tablet, Disp: , Rfl:  .  atorvastatin (LIPITOR) 80 MG tablet, Take 1 tablet by mouth daily., Disp: , Rfl:  .  BRILINTA 90 MG TABS tablet, Take 90 mg by mouth 2 (two) times daily., Disp: , Rfl:  .  carvedilol (COREG) 6.25 MG tablet, TAKE 1 TABLET(6.25 MG) BY MOUTH TWICE DAILY WITH A MEAL, Disp: 180 tablet, Rfl: 0 .  Cholecalciferol 25 MCG (1000 UT) tablet, Take by mouth., Disp: , Rfl:  .  cyanocobalamin 100 MCG tablet, Take by mouth., Disp: , Rfl:  .  Ferrous Sulfate (IRON) 325 (65 Fe) MG TABS, 1 tablet, Disp: , Rfl:  .  Fluticasone-Umeclidin-Vilant (TRELEGY ELLIPTA) 100-62.5-25 MCG/INH AEPB, 1 puff, Disp: , Rfl:  .  gabapentin (NEURONTIN) 100 MG capsule, 2 at 7 pm, Disp: , Rfl:  .  levETIRAcetam (KEPPRA) 1000 MG tablet, 1 q am and 1/2 q pm, Disp: , Rfl:  .  nitroGLYCERIN (NITROSTAT) 0.4  MG SL tablet, Place 1 tablet (0.4 mg total) under the tongue every 5 (five) minutes as needed for chest pain. If you require more than two tablets five minutes apart go to the nearest ER via EMS., Disp: 30 tablet, Rfl: 0 .  omeprazole (PRILOSEC) 20 MG capsule, 1 capsule, Disp: , Rfl:  .  Pyridoxine HCl (VITAMIN B6) 100 MG TABS, 1 tablet, Disp: , Rfl:  .  sacubitril-valsartan (ENTRESTO) 24-26 MG, Take 1 tablet by mouth 2 (two) times daily., Disp: 60 tablet, Rfl: 0  Allergies  Allergen Reactions  . Lamotrigine Hives  . Oxycodone-Aspirin     Other reaction(s): rash  . Ace Inhibitors Cough  . Oxycodone-Acetaminophen Rash    Other reaction(s): rash    Social History   Socioeconomic History  . Marital status: Single    Spouse name: Not on file  . Number of children: 1  . Years of education: Not on file  . Highest education level: Not on file  Occupational History  . Not on file  Tobacco Use  . Smoking status: Former Smoker    Packs/day: 1.00    Years: 30.00    Pack years: 30.00    Types: Cigarettes    Quit date: 09/21/2019  Years since quitting: 1.3  . Smokeless tobacco: Never Used  Vaping Use  . Vaping Use: Never used  Substance and Sexual Activity  . Alcohol use: Never  . Drug use: Never  . Sexual activity: Not on file  Other Topics Concern  . Not on file  Social History Narrative  . Not on file   Social Determinants of Health   Financial Resource Strain: Not on file  Food Insecurity: Not on file  Transportation Needs: Not on file  Physical Activity: Not on file  Stress: Not on file  Social Connections: Not on file  Intimate Partner Violence: Not on file       Objective:  Physical Exam  General: AAO x3, NAD  Dermatological: Nails are hypertrophic, dystrophic, brittle, discolored, elongated 10. No surrounding redness or drainage. Tenderness nails 1-5 bilaterally. No open lesions or pre-ulcerative lesions are identified today.  Vascular: Dorsalis Pedis  artery and Posterior Tibial artery pedal pulses are palpable bilateral with immedate capillary fill time. There is no pain with calf compression, swelling, warmth, erythema.   Neruologic: Grossly intact via light touch bilateral.   Musculoskeletal: Hammertoes present.      Assessment:   Symptomatic onychomycosis     Plan:  -Treatment options discussed including all alternatives, risks, and complications -Etiology of symptoms were discussed -Nails debrided 10 without complications or bleeding. -Daily foot inspection -Moisturizer daily but not interdigitally. -Follow-up in 3 months or sooner if any problems arise. In the meantime, encouraged to call the office with any questions, concerns, change in symptoms.   Ovid Curd, DPM

## 2021-01-18 ENCOUNTER — Other Ambulatory Visit: Payer: Self-pay | Admitting: Cardiology

## 2021-01-18 DIAGNOSIS — I5032 Chronic diastolic (congestive) heart failure: Secondary | ICD-10-CM

## 2021-01-20 DIAGNOSIS — G4733 Obstructive sleep apnea (adult) (pediatric): Secondary | ICD-10-CM | POA: Diagnosis not present

## 2021-01-20 DIAGNOSIS — J9611 Chronic respiratory failure with hypoxia: Secondary | ICD-10-CM | POA: Diagnosis not present

## 2021-01-20 DIAGNOSIS — D649 Anemia, unspecified: Secondary | ICD-10-CM | POA: Diagnosis not present

## 2021-01-20 DIAGNOSIS — I270 Primary pulmonary hypertension: Secondary | ICD-10-CM | POA: Diagnosis not present

## 2021-01-20 DIAGNOSIS — R569 Unspecified convulsions: Secondary | ICD-10-CM | POA: Diagnosis not present

## 2021-01-20 DIAGNOSIS — Z8673 Personal history of transient ischemic attack (TIA), and cerebral infarction without residual deficits: Secondary | ICD-10-CM | POA: Diagnosis not present

## 2021-01-20 DIAGNOSIS — I5030 Unspecified diastolic (congestive) heart failure: Secondary | ICD-10-CM | POA: Diagnosis not present

## 2021-01-20 DIAGNOSIS — E559 Vitamin D deficiency, unspecified: Secondary | ICD-10-CM | POA: Diagnosis not present

## 2021-01-20 DIAGNOSIS — I10 Essential (primary) hypertension: Secondary | ICD-10-CM | POA: Diagnosis not present

## 2021-01-20 DIAGNOSIS — I2510 Atherosclerotic heart disease of native coronary artery without angina pectoris: Secondary | ICD-10-CM | POA: Diagnosis not present

## 2021-01-20 DIAGNOSIS — E785 Hyperlipidemia, unspecified: Secondary | ICD-10-CM | POA: Diagnosis not present

## 2021-01-20 DIAGNOSIS — G629 Polyneuropathy, unspecified: Secondary | ICD-10-CM | POA: Diagnosis not present

## 2021-01-22 DIAGNOSIS — I272 Pulmonary hypertension, unspecified: Secondary | ICD-10-CM | POA: Diagnosis not present

## 2021-02-12 ENCOUNTER — Ambulatory Visit: Payer: Medicare Other | Admitting: Cardiology

## 2021-02-14 ENCOUNTER — Other Ambulatory Visit: Payer: Self-pay | Admitting: Cardiology

## 2021-02-14 DIAGNOSIS — I5032 Chronic diastolic (congestive) heart failure: Secondary | ICD-10-CM

## 2021-02-22 DIAGNOSIS — I272 Pulmonary hypertension, unspecified: Secondary | ICD-10-CM | POA: Diagnosis not present

## 2021-02-23 ENCOUNTER — Encounter: Payer: Self-pay | Admitting: Pulmonary Disease

## 2021-02-23 ENCOUNTER — Other Ambulatory Visit: Payer: Self-pay

## 2021-02-23 ENCOUNTER — Ambulatory Visit (INDEPENDENT_AMBULATORY_CARE_PROVIDER_SITE_OTHER): Payer: Medicare Other | Admitting: Pulmonary Disease

## 2021-02-23 VITALS — BP 138/62 | HR 61 | Temp 98.2°F | Ht 62.0 in | Wt 261.6 lb

## 2021-02-23 DIAGNOSIS — J441 Chronic obstructive pulmonary disease with (acute) exacerbation: Secondary | ICD-10-CM | POA: Diagnosis not present

## 2021-02-23 NOTE — Patient Instructions (Signed)
See you in 6 weeks  POC - refer to pulmonary rehab -portable oxygen concentrator- lighter model  Exercise daily  Continue trelegy  Call with significant symptoms

## 2021-02-23 NOTE — Progress Notes (Signed)
Laurie Wall    [id number]    06/14/1956  Primary Care Physician:Husain, Jerelyn Scott, MD  Referring Physician: Georgann Housekeeper, MD [address] [place],  [place] [address]  Chief complaint:   Patient being seen for history of chronic obstructive pulmonary disease, pulmonary hypertension, congestive heart failure, shortness of breath  HPI:  Patient with a history of obstructive lung disease -Remains on bronchodilators  History of pulmonary hypertension  History of coronary artery disease with non-ST elevation MI -Multivessel coronary artery disease  History of stroke-January 2021 History of seizures-January 2021  Compliant with medications Does have some dysarthria  She has obstructive sleep apnea for which she is on CPAP therapy Chronic respiratory failure on oxygen supplementation  She relocated to [facility] from [facility]  Her sister helps take care of her  Quit smoking about 18 months ago, 30-pack-year smoking history  No pertinent occupational history, no pets, no recent travel  Outpatient Encounter Medications as of 02/23/2021  Medication Sig   amLODipine (NORVASC) 10 MG tablet Take 10 mg by mouth daily.   aspirin 81 MG chewable tablet 1 tablet   atorvastatin (LIPITOR) 80 MG tablet Take 1 tablet by mouth daily.   BRILINTA 90 MG TABS tablet Take 90 mg by mouth 2 (two) times daily.   carvedilol (COREG) 6.25 MG tablet TAKE 1 TABLET(6.25 MG) BY MOUTH TWICE DAILY WITH A MEAL   Cholecalciferol 25 MCG (1000 UT) tablet Take by mouth.   cyanocobalamin 100 MCG tablet Take by mouth.   ENTRESTO 24-26 MG TAKE 1 TABLET BY MOUTH TWICE DAILY   Ferrous Sulfate (IRON) 325 (65 Fe) MG TABS 1 tablet   Fluticasone-Umeclidin-Vilant (TRELEGY ELLIPTA) 100-62.5-25 MCG/INH AEPB 1 puff   gabapentin (NEURONTIN) 100 MG capsule 2 at 7 pm   levETIRAcetam (KEPPRA) 1000 MG tablet 1 q am and 1/2 q pm   omeprazole (PRILOSEC) 20 MG capsule 1 capsule    Pyridoxine HCl (VITAMIN B6) 100 MG TABS 1 tablet   nitroGLYCERIN (NITROSTAT) 0.4 MG SL tablet Place 1 tablet (0.4 mg total) under the tongue every 5 (five) minutes as needed for chest pain. If you require more than two tablets five minutes apart go to the nearest ER via EMS.   No facility-administered encounter medications on file as of 02/23/2021.    Allergies as of 02/23/2021 - Review Complete 02/23/2021  Allergen Reaction Noted   Lamotrigine Hives 01/30/2020   Oxycodone-aspirin  06/21/2019   Ace inhibitors Cough 06/21/2019   Oxycodone-acetaminophen Rash 06/21/2019    Past Medical History:  Diagnosis Date   CHF (congestive heart failure) (HCC)    COPD (chronic obstructive pulmonary disease) (HCC)    Coronary artery disease    Hyperlipidemia    Hypertension    NSTEMI (non-ST elevated myocardial infarction) (HCC)    OSA on CPAP    Oxygen dependent    Pulmonary hypertension (HCC)    Seizure (HCC)    Stroke [facility])     Past Surgical History:  Procedure Laterality Date   CARDIAC CATHETERIZATION     MASTECTOMY Bilateral    TUBAL LIGATION      Family History  Problem Relation Age of Onset   Heart attack Mother    Heart attack Father    Asthma Sister    Hyperlipidemia Sister    Hypertension Brother    Hyperlipidemia Sister     Social History   Socioeconomic History   Marital status: Single  Spouse name: Not on file   Number of children: 1   Years of education: Not on file   Highest education level: Not on file  Occupational History   Not on file  Tobacco Use   Smoking status: Former    Packs/day: 1.00    Years: 30.00    Pack years: 30.00    Types: Cigarettes    Quit date: 09/21/2019    Years since quitting: 1.4   Smokeless tobacco: Never  Vaping Use   Vaping Use: Never used  Substance and Sexual Activity   Alcohol use: Never   Drug use: Never   Sexual activity: Not on file  Other Topics Concern   Not on file  Social History Narrative   Not on file    Social Determinants of Health   Financial Resource Strain: Not on file  Food Insecurity: Not on file  Transportation Needs: Not on file  Physical Activity: Not on file  Stress: Not on file  Social Connections: Not on file  Intimate Partner Violence: Not on file    Review of Systems  Constitutional:  Negative for fever.  Respiratory:  Positive for shortness of breath.   Psychiatric/Behavioral:  Positive for sleep disturbance.    Vitals:   02/23/21 1037  BP: 138/62  Pulse: 61  Temp: 98.2 F (36.8 C)  SpO2: 100%     Physical Exam Constitutional:      Appearance: She is obese.  HENT:     Head: Normocephalic and atraumatic.     Nose: Nose normal.     Mouth/Throat:     Mouth: Mucous membranes are moist.  Cardiovascular:     Rate and Rhythm: Normal rate and regular rhythm.     Pulses: Normal pulses.     Heart sounds: Normal heart sounds. No murmur heard. Pulmonary:     Effort: Pulmonary effort is normal. No respiratory distress.     Breath sounds: No stridor. No wheezing or rhonchi.  Musculoskeletal:     Cervical back: No rigidity or tenderness.  Neurological:     Mental Status: She is alert.  Psychiatric:        Mood and Affect: Mood normal.     Data Reviewed: Care everywhere visits reviewed  Notes by Dr. Celine Mans pulmonary from December 2021 reviewed  Assessment:  Pulmonary hypertension  Chronic obstructive pulmonary disease  Obstructive sleep apnea -On CPAP therapy  Chronic respiratory failure on oxygen supplementation -Feels the machine is too heavy  Deconditioning  Coronary artery disease  Diastolic heart failure  Plan/Recommendations: Encouraged to continue using CPAP on a regular basis  Inhaler technique was reviewed and encouraged about how to use it properly  Graded exercises as tolerated  We will try and help obtain a lighter oxygen device  Referral for pulmonary rehab  Continue inhalers  I spent 45 minutes dedicated to the care  of this patient on the date of this encounter to include previsit review of records, face-to-face time with the patient discussing conditions above, post visit ordering of testing, clinical documentation with electronic health record and communicated necessary findings to members of the patient's care team  Virl Diamond MD [facility] 02/23/2021, 1:02 PM  CC: Georgann Housekeeper, MD

## 2021-02-25 ENCOUNTER — Encounter (HOSPITAL_COMMUNITY): Payer: Self-pay | Admitting: *Deleted

## 2021-02-25 ENCOUNTER — Telehealth: Payer: Self-pay | Admitting: Pulmonary Disease

## 2021-02-25 DIAGNOSIS — I272 Pulmonary hypertension, unspecified: Secondary | ICD-10-CM

## 2021-02-25 NOTE — Telephone Encounter (Signed)
Placed referral order whith dx of pulmonary HTN. Notified pulmonary rehab order had been placed. Nothing further needed at this time.

## 2021-02-25 NOTE — Telephone Encounter (Signed)
Okay to make a referral with diagnosis of pulmonary hypertension

## 2021-02-25 NOTE — Telephone Encounter (Signed)
Pulmonary Rehab was referral was placed 02/23/21, with COPD diagnosis. Pulmonary Rehab cannot use the diagnosis of COPD without PFT results supporting.  Patient does have diagnosis of pulmonary hypertension and that would qualify patient. Laurie Wall is requesting a new referral for pulmonary rehab with pulmonary hypertension diagnosis.   Message routed to Dr. Wynona Neat to advise

## 2021-02-25 NOTE — Telephone Encounter (Signed)
Carlette calling because AO put in a referral for pt to do pulm rehab with a dx of COPD, pt doesn't have pft results supporting that. Pt also has pulm hypertension and that will qualify. Carlette needs AO to put in another referral with different dx. Pulm hypertension would be sufficient. Please advise [phone]

## 2021-02-25 NOTE — Progress Notes (Signed)
Received referral from Dr. Wynona Neat for this pt to participate in pulmonary rehab with the the diagnosis of Pulmonary Hypertension.  Clinical review of pt follow up appt on 6/20 Pulmonary office note.  Also reviewed Cardiology follow up appt with Dr. Odis Hollingshead.  Pt with Covid Risk Score - 5. Pt appropriate for scheduling for Pulmonary rehab.  Will forward to support staff for scheduling  when able as there is a waiting list and verification of insurance eligibility/benefits with pt consent. Alanson Aly, BSN Cardiac and Emergency planning/management officer

## 2021-02-26 ENCOUNTER — Encounter: Payer: Self-pay | Admitting: Cardiology

## 2021-02-26 ENCOUNTER — Ambulatory Visit: Payer: Medicare Other | Admitting: Cardiology

## 2021-02-26 ENCOUNTER — Other Ambulatory Visit: Payer: Self-pay

## 2021-02-26 VITALS — BP 132/59 | HR 60 | Temp 97.0°F | Resp 16 | Ht 62.0 in | Wt 261.0 lb

## 2021-02-26 DIAGNOSIS — I5032 Chronic diastolic (congestive) heart failure: Secondary | ICD-10-CM

## 2021-02-26 DIAGNOSIS — J449 Chronic obstructive pulmonary disease, unspecified: Secondary | ICD-10-CM

## 2021-02-26 DIAGNOSIS — G4733 Obstructive sleep apnea (adult) (pediatric): Secondary | ICD-10-CM

## 2021-02-26 DIAGNOSIS — I2510 Atherosclerotic heart disease of native coronary artery without angina pectoris: Secondary | ICD-10-CM

## 2021-02-26 DIAGNOSIS — Z8249 Family history of ischemic heart disease and other diseases of the circulatory system: Secondary | ICD-10-CM | POA: Diagnosis not present

## 2021-02-26 DIAGNOSIS — Z8673 Personal history of transient ischemic attack (TIA), and cerebral infarction without residual deficits: Secondary | ICD-10-CM

## 2021-02-26 DIAGNOSIS — E782 Mixed hyperlipidemia: Secondary | ICD-10-CM | POA: Diagnosis not present

## 2021-02-26 DIAGNOSIS — Z9989 Dependence on other enabling machines and devices: Secondary | ICD-10-CM

## 2021-02-26 DIAGNOSIS — I10 Essential (primary) hypertension: Secondary | ICD-10-CM

## 2021-02-26 DIAGNOSIS — I252 Old myocardial infarction: Secondary | ICD-10-CM | POA: Diagnosis not present

## 2021-02-26 DIAGNOSIS — Z9981 Dependence on supplemental oxygen: Secondary | ICD-10-CM

## 2021-02-26 NOTE — Progress Notes (Signed)
ID:  Laurie Wall, DOB 02/19/1956, MRN [id number]  PCP: Wenda Low, MD  Cardiologist:  Rex Kras, DO, [facility] (established care 11/18/2020) Former Cardiology Providers: Caren Hazy MD, Dr. Elenore Paddy    Date: 02/26/21 Last Office Visit: 12/23/2020  Chief Complaint  Patient presents with   follow up   Coronary Artery Disease   Congestive Heart Failure    HPI  Laurie Wall is a 65 y.o. female who presents to the office with a chief complaint of " follow up for CAD and CHF." Patient's past medical history and cardiovascular risk factors include: History of non-STEMI, history of stroke, heart failure with preserved EF, three-vessel coronary artery disease, hyperlipidemia, hypertension, OSA on CPAP, pulmonary hypertension, COPD with oxygen dependence 2 L, postmenopausal female, advanced age, obesity.  She is referred to the office at the request of Laurie Repress, FNP to reestablish care with cardiology after moving to [facility] from [place], [place] given her history of non-STEMI, CAD, stroke, pulmonary hypertension.  Patient is accompanied by her sister Laurie Wall who can be reached at [phone].  Patient provides verbal consent in regards to discussing her medical information in her presence.  In January 2021 patient was hospitalized at [facility] and diagnosed with non-STEMI and underwent left heart catheterization was found to have multivessel CAD.  However due to recent stroke the shared decision was to treat her medically with close follow-up.  Since then she has moved to [facility] and established care with our practice as of March 2022.  Patient's medications have been uptitrated in a stepwise fashion and she is doing well clinically.  She denies any chest pain or shortness of breath at rest or with effort related activities.  Patient's blood pressures at home are very well controlled with SBP ranging between 120-130 mmHg and pulse around 60 bpm.  Since last office  visit she had an echocardiogram which notes preserved LVEF, grade 2 diastolic impairment, and no significant valvular heart disease.  She has not been hospitalized or seen in urgent care for cardiovascular symptoms.  Family history of premature coronary disease but no sudden cardiac death.  Brother had a myocardial infarction at the age of 67.  FUNCTIONAL STATUS: No structured exercise program or daily routine.   ALLERGIES: Allergies  Allergen Reactions   Lamotrigine Hives   Oxycodone-Aspirin     Other reaction(s): rash   Ace Inhibitors Cough   Oxycodone-Acetaminophen Rash    Other reaction(s): rash    MEDICATION LIST PRIOR TO VISIT: Current Meds  Medication Sig   amLODipine (NORVASC) 10 MG tablet Take 10 mg by mouth daily.   aspirin 81 MG chewable tablet 1 tablet   atorvastatin (LIPITOR) 80 MG tablet Take 1 tablet by mouth daily.   carvedilol (COREG) 6.25 MG tablet TAKE 1 TABLET(6.25 MG) BY MOUTH TWICE DAILY WITH A MEAL   Cholecalciferol 25 MCG (1000 UT) tablet Take by mouth.   cyanocobalamin 100 MCG tablet Take by mouth.   ENTRESTO 24-26 MG TAKE 1 TABLET BY MOUTH TWICE DAILY   Ferrous Sulfate (IRON) 325 (65 Fe) MG TABS 1 tablet   Fluticasone-Umeclidin-Vilant (TRELEGY ELLIPTA) 100-62.5-25 MCG/INH AEPB 1 puff   gabapentin (NEURONTIN) 100 MG capsule 2 at 7 pm   levETIRAcetam (KEPPRA) 1000 MG tablet 1 q am and 1/2 q pm   nitroGLYCERIN (NITROSTAT) 0.4 MG SL tablet Place 1 tablet (0.4 mg total) under the tongue every 5 (five) minutes as needed for chest pain. If you require more than two tablets five minutes apart  go to the nearest ER via EMS.   omeprazole (PRILOSEC) 20 MG capsule 1 capsule   Pyridoxine HCl (VITAMIN B6) 100 MG TABS 1 tablet     PAST MEDICAL HISTORY: Past Medical History:  Diagnosis Date   CHF (congestive heart failure) (HCC)    COPD (chronic obstructive pulmonary disease) (HCC)    Coronary artery disease    Hyperlipidemia    Hypertension    NSTEMI (non-ST  elevated myocardial infarction) (HCC)    OSA on CPAP    Oxygen dependent    Pulmonary hypertension (HCC)    Seizure ([place])    Stroke ([place])     PAST SURGICAL HISTORY: Past Surgical History:  Procedure Laterality Date   CARDIAC CATHETERIZATION     MASTECTOMY Bilateral    TUBAL LIGATION      FAMILY HISTORY: The patient family history includes Asthma in her sister; Heart attack in her father and mother; Hyperlipidemia in her sister and sister; Hypertension in her brother.  SOCIAL HISTORY:  The patient  reports that she quit smoking about 17 months ago. Her smoking use included cigarettes. She has a 30.00 pack-year smoking history. She has never used smokeless tobacco. She reports that she does not drink alcohol and does not use drugs.  REVIEW OF SYSTEMS: Review of Systems  Constitutional: Negative for chills and fever.  HENT:  Negative for hoarse voice and nosebleeds.   Eyes:  Negative for discharge, double vision and pain.  Cardiovascular:  Negative for chest pain, claudication, dyspnea on exertion, leg swelling, near-syncope, orthopnea, palpitations, paroxysmal nocturnal dyspnea and syncope.  Respiratory:  Negative for hemoptysis and shortness of breath.   Musculoskeletal:  Negative for muscle cramps and myalgias.  Gastrointestinal:  Negative for abdominal pain, constipation, diarrhea, hematemesis, hematochezia, melena, nausea and vomiting.  Neurological:  Positive for seizures. Negative for dizziness and light-headedness.       Speaking difficulties (expressive aphasia)   PHYSICAL EXAM: Vitals with BMI 02/26/2021 02/23/2021 12/23/2020  Height 5' 2" 5' 2" 5' 2"  Weight 261 lbs 261 lbs 10 oz 260 lbs 6 oz  BMI 47.73 02.58 52.77  Systolic 824 235 361  Diastolic 59 62 70  Pulse 60 61 62    CONSTITUTIONAL: Appears older than stated age, hemodynamically stable, no acute distress.    SKIN: Skin is warm and dry. No rash noted. No cyanosis. No pallor. No jaundice HEAD: Normocephalic  and atraumatic.  EYES: No scleral icterus MOUTH/THROAT: Moist oral membranes.  NECK: No JVD present. No thyromegaly noted. No carotid bruits  LYMPHATIC: No visible cervical adenopathy.  CHEST Normal respiratory effort. No intercostal retractions  LUNGS: Clear to auscultation bilaterally.  No stridor. No wheezes. No rales.  CARDIOVASCULAR: Regular, positive S1-S2, no murmurs rubs or gallops appreciated. ABDOMINAL: Obese, soft, nontender, nondistended, positive bowel sounds in all 4 quadrants, no apparent ascites.  EXTREMITIES: Trace bilateral peripheral edema, darker skin pigmentation to suggest chronic venous insufficiency, decreased bilateral DP and PT pulses. HEMATOLOGIC: No significant bruising NEUROLOGIC: Oriented to person, place, and time. Nonfocal. Normal muscle tone.  Walks with a walker. PSYCHIATRIC: Normal mood and affect. Normal behavior. Cooperative  CARDIAC DATABASE: EKG: 11/18/2020: Sinus bradycardia, 52 bpm, normal axis, old anteroseptal infarct, ST-T changes in inferolateral leads suggestive of possible ischemia, without underlying injury pattern.  Echocardiogram: [facility] 04/25/2020:  12/30/2020: Left ventricle cavity is normal in size. Moderate concentric hypertrophy of the left ventricle. Normal global wall motion. Normal LV systolic function with EF 64%. Doppler evidence of grade II (  pseudonormal) diastolic dysfunction, elevated LAP.  No significant valvular abnormality. Normal right atrial pressure.   Stress Testing: No results found for this or any previous visit from the past 1095 days.  Heart Catheterization: [facility] 10/04/2019: 1. Normal LV filling pressures with mean PCWP of 8 mm Hg and LVEDP of 11 mm Hg  2. Normal LV systolic function  3. No significant renal artery stenosis  4. Multivessel coronary artery disease including 80% proximal LAD stenosis, 60% mid-circumflex stenosis, 50% OM1 stenosis, 60% OM2 stenosis and 95% mid-RCA stenosis  5.  Pulmonary hypertension with PA pressure of 56/19 mm Hg with a mean of 32 mm Hg   LABORATORY DATA: No flowsheet data found.  CMP Latest Ref Rng & Units 12/30/2020  Glucose 65 - 99 mg/dL 82  BUN 8 - 27 mg/dL 15  Creatinine 0.57 - 1.00 mg/dL 0.92  Sodium 134 - 144 mmol/L 141  Potassium 3.5 - 5.2 mmol/L 3.9  Chloride 96 - 106 mmol/L 100  CO2 20 - 29 mmol/L 29  Calcium 8.7 - 10.3 mg/dL 9.5    Lipid Panel  No results found for: CHOL, TRIG, HDL, CHOLHDL, VLDL, LDLCALC, LDLDIRECT, LABVLDL  No components found for: NTPROBNP Recent Labs    12/30/20 1448  PROBNP 161   No results for input(s): TSH in the last 8760 hours.  BMP Recent Labs    12/30/20 1448  NA 141  K 3.9  CL 100  CO2 29  GLUCOSE 82  BUN 15  CREATININE 0.92  CALCIUM 9.5    HEMOGLOBIN A1C No results found for: HGBA1C, MPG  External Labs: Collected: 11/13/2020 provided from care [facility] clinic) Hemoglobin 12.9 g/dL, hematocrit 40.5% NT proBNP: 194 Creatinine 1 mg/dL. eGFR: 62 mL/min per 1.73 m Sodium 141, potassium 3.5, chloride 97, bicarb 35, AST 16, ALT 18, alkaline phosphatase 127 Lipid profile: Total cholesterol 154, triglycerides 75, HDL 57, LDL 82.  Hemoglobin A1c: 6.1 TSH: 1.53  IMPRESSION:    ICD-10-CM   1. Atherosclerosis of native coronary artery of native heart without angina pectoris  I25.10     2. History of non-ST elevation myocardial infarction (NSTEMI)  I25.2     3. Chronic heart failure with preserved ejection fraction (HCC)  K35.46 Basic metabolic panel    Pro b natriuretic peptide (BNP)    Magnesium    4. History of TIA (transient ischemic attack)  Z86.73     5. Benign hypertension  I10     6. Mixed hyperlipidemia  E78.2     7. OSA on CPAP  G47.33    Z99.89     8. Chronic obstructive pulmonary disease, unspecified COPD type ([place])  J44.9     9. Oxygen dependent  Z99.81     10. Family history of premature CAD  Z82.49        RECOMMENDATIONS: Maudean Hoffmann is a 65 y.o. female whose past medical history and cardiac risk factors include: History of non-STEMI, history of stroke, heart failure with preserved EF, three-vessel coronary artery disease, hyperlipidemia, hypertension, OSA on CPAP, pulmonary hypertension, COPD with oxygen dependence 2 L, postmenopausal female, advanced age, obesity.  Establish coronary artery disease without angina pectoris: Had a non-STEMI in January 2021 and was noted to have multivessel CAD, HFpEF and given the acute stroke was decided to treat medically. Denies angina pectoris  Medications reconciled.   Discussed further up titration of guideline directed medical therapy.  However, patient states that she is relatively stable and would  like to continue the current medical therapy and reevaluate in 6 months.   Most recent labs from April 2022 independently reviewed and noted below for further reference. Echocardiogram results reviewed with the patient and noted above for further reference.  History of non-STEMI: Continue aspirin and statin therapy.  Patient prefers up titration of GDMT as discussed above  Chronic heart failure with preserved EF, stage C, NYHA class II: Overall euvolemic. Tolerated initiation of Entresto well without any side effects or intolerances.  Recommended further titration to 49/51 mg p.o. twice daily but patient would like to continue the current dose.   Meds reconciled as noted above Labs before the next office visit.  Benign essential hypertension: Office blood pressures better controlled. I have asked her to keep a log of her blood pressures and to bring it in at the next office visit. Low-salt diet recommended, discussed DASH diet. Encourage medication compliance.  Hyperlipidemia: Continue statin therapy.  Most recent lipid profile independently reviewed and findings noted above.  History of stroke: Residual deficits include slurred speech.  Educated on the importance of  secondary prevention. Continue aspirin and statin therapy. Most recent hemoglobin A1c consistent with prediabetes.  Patient educated on the importance of glycemic control. Most recent lipid profile reviewed. Encouraged her regarding the importance of blood pressure monitoring and medication compliance.  COPD on 2 L nasal cannula: I have asked the patient to consider seeing pulmonary medicine to reestablish care given her underlying COPD and sleep apnea.  Will defer further management to primary/pulmonary medicine.   FINAL MEDICATION LIST END OF ENCOUNTER: No orders of the defined types were placed in this encounter.   Medications Discontinued During This Encounter  Medication Reason   BRILINTA 90 MG TABS tablet Error     Current Outpatient Medications:    amLODipine (NORVASC) 10 MG tablet, Take 10 mg by mouth daily., Disp: , Rfl:    aspirin 81 MG chewable tablet, 1 tablet, Disp: , Rfl:    atorvastatin (LIPITOR) 80 MG tablet, Take 1 tablet by mouth daily., Disp: , Rfl:    carvedilol (COREG) 6.25 MG tablet, TAKE 1 TABLET(6.25 MG) BY MOUTH TWICE DAILY WITH A MEAL, Disp: 180 tablet, Rfl: 0   Cholecalciferol 25 MCG (1000 UT) tablet, Take by mouth., Disp: , Rfl:    cyanocobalamin 100 MCG tablet, Take by mouth., Disp: , Rfl:    ENTRESTO 24-26 MG, TAKE 1 TABLET BY MOUTH TWICE DAILY, Disp: 60 tablet, Rfl: 0   Ferrous Sulfate (IRON) 325 (65 Fe) MG TABS, 1 tablet, Disp: , Rfl:    Fluticasone-Umeclidin-Vilant (TRELEGY ELLIPTA) 100-62.5-25 MCG/INH AEPB, 1 puff, Disp: , Rfl:    gabapentin (NEURONTIN) 100 MG capsule, 2 at 7 pm, Disp: , Rfl:    levETIRAcetam (KEPPRA) 1000 MG tablet, 1 q am and 1/2 q pm, Disp: , Rfl:    nitroGLYCERIN (NITROSTAT) 0.4 MG SL tablet, Place 1 tablet (0.4 mg total) under the tongue every 5 (five) minutes as needed for chest pain. If you require more than two tablets five minutes apart go to the nearest ER via EMS., Disp: 30 tablet, Rfl: 0   omeprazole (PRILOSEC) 20 MG  capsule, 1 capsule, Disp: , Rfl:    Pyridoxine HCl (VITAMIN B6) 100 MG TABS, 1 tablet, Disp: , Rfl:   Orders Placed This Encounter  Procedures   Basic metabolic panel   Pro b natriuretic peptide (BNP)   Magnesium   There are no Patient Instructions on file for this visit.   --Continue  cardiac medications as reconciled in final medication list. --Return in about 6 months (around 08/28/2021) for Follow up, CAD, heart failure management.. Or sooner if needed. --Continue follow-up with your primary care physician regarding the management of your other chronic comorbid conditions.  Patient's questions and concerns were addressed to her satisfaction. She voices understanding of the instructions provided during this encounter.   This note was created using a voice recognition software as a result there may be grammatical errors inadvertently enclosed that do not reflect the nature of this encounter. Every attempt is made to correct such errors.   Rex Kras, [place], [facility]  Pager: [phone] Office: [phone]

## 2021-03-02 ENCOUNTER — Telehealth (HOSPITAL_COMMUNITY): Payer: Self-pay

## 2021-03-02 NOTE — Telephone Encounter (Signed)
Called patient to see if she is interested in the Pulmonary Rehab Program. Patient expressed interest. Explained scheduling process and went over insurance, patient verbalized understanding. Also adv pt where we are with scheduling for PR and that we have a backlog 1-3 months. 

## 2021-03-02 NOTE — Telephone Encounter (Signed)
Pt insurance is active and benefits verified through [facility] Medicare. Co-pay $0.00, DED $0.00/$0.00 met, out of pocket $7,550.00/$0.00 met, co-insurance 0%. No pre-authorization required. Odette F./[employer], 02/27/21 @ 3:47PM, ZVJ#[id number]   Will contact patient to see if she is interested in the PulmonaryRehab Program.

## 2021-03-07 ENCOUNTER — Other Ambulatory Visit: Payer: Self-pay | Admitting: Cardiology

## 2021-03-07 DIAGNOSIS — I5032 Chronic diastolic (congestive) heart failure: Secondary | ICD-10-CM

## 2021-03-07 DIAGNOSIS — I2510 Atherosclerotic heart disease of native coronary artery without angina pectoris: Secondary | ICD-10-CM

## 2021-03-07 DIAGNOSIS — I10 Essential (primary) hypertension: Secondary | ICD-10-CM

## 2021-03-23 ENCOUNTER — Other Ambulatory Visit: Payer: Self-pay | Admitting: Cardiology

## 2021-03-23 DIAGNOSIS — I10 Essential (primary) hypertension: Secondary | ICD-10-CM

## 2021-03-23 DIAGNOSIS — I5032 Chronic diastolic (congestive) heart failure: Secondary | ICD-10-CM

## 2021-03-23 DIAGNOSIS — I2510 Atherosclerotic heart disease of native coronary artery without angina pectoris: Secondary | ICD-10-CM

## 2021-03-24 DIAGNOSIS — G4733 Obstructive sleep apnea (adult) (pediatric): Secondary | ICD-10-CM | POA: Diagnosis not present

## 2021-03-24 DIAGNOSIS — I272 Pulmonary hypertension, unspecified: Secondary | ICD-10-CM | POA: Diagnosis not present

## 2021-04-03 ENCOUNTER — Other Ambulatory Visit: Payer: Self-pay | Admitting: Cardiology

## 2021-04-03 DIAGNOSIS — I5032 Chronic diastolic (congestive) heart failure: Secondary | ICD-10-CM

## 2021-04-06 ENCOUNTER — Encounter: Payer: Self-pay | Admitting: Pulmonary Disease

## 2021-04-06 ENCOUNTER — Ambulatory Visit (INDEPENDENT_AMBULATORY_CARE_PROVIDER_SITE_OTHER): Payer: Medicare Other | Admitting: Pulmonary Disease

## 2021-04-06 ENCOUNTER — Other Ambulatory Visit: Payer: Self-pay

## 2021-04-06 VITALS — BP 124/80 | HR 55 | Temp 98.5°F | Ht 62.0 in | Wt 263.8 lb

## 2021-04-06 DIAGNOSIS — G4733 Obstructive sleep apnea (adult) (pediatric): Secondary | ICD-10-CM | POA: Diagnosis not present

## 2021-04-06 DIAGNOSIS — R0602 Shortness of breath: Secondary | ICD-10-CM

## 2021-04-06 DIAGNOSIS — Z9989 Dependence on other enabling machines and devices: Secondary | ICD-10-CM

## 2021-04-06 MED ORDER — ALBUTEROL SULFATE HFA 108 (90 BASE) MCG/ACT IN AERS: 2.0000 | INHALATION_SPRAY | Freq: Four times a day (QID) | RESPIRATORY_TRACT | 6 refills | Status: AC | PRN

## 2021-04-06 NOTE — Patient Instructions (Signed)
Albuterol every 6 as needed for shortness of breath  Continue Trelegy on a daily basis  Graded exercises as tolerated Continue to try and stay active on a daily basis  You should get follow-up calls from pulmonary rehab -Hopefully you get to stop soon  I will see you back in 4 to 5 months

## 2021-04-06 NOTE — Progress Notes (Signed)
Laurie Wall    [id number]    [birth year]  Primary Care Physician:Husain, Jerelyn Scott, MD  Referring Physician: Georgann Housekeeper, MD [address] [place],  [place] [address]  Chief complaint:   Patient is in for follow-up of shortness of breath, history of congestive heart failure, history of obstructive lung disease, history of obstructive sleep apnea  HPI:  Patient with a history of obstructive lung disease -Remains on bronchodilators  History of pulmonary hypertension  History of coronary artery disease, multivessel coronary artery disease  History of CVA January 2021 History of seizures January 2021  She has been doing relatively well  She is trying better to become more active  Her sister is very involved in her care and helps her with activities and medications  She does have significant dysarthria  She continues on oxygen supplementation on a regular basis, compliant with CPAP  She relocated to [facility] from [facility]  Quit smoking about 21 months ago, 30-pack-year smoking history  No pertinent occupational history, no pets, no recent travel  Outpatient Encounter Medications as of 04/06/2021  Medication Sig   amLODipine (NORVASC) 10 MG tablet Take 10 mg by mouth daily.   aspirin 81 MG chewable tablet 1 tablet   atorvastatin (LIPITOR) 80 MG tablet Take 1 tablet by mouth daily.   carvedilol (COREG) 6.25 MG tablet TAKE 1 TABLET(6.25 MG) BY MOUTH TWICE DAILY WITH A MEAL   Cholecalciferol 25 MCG (1000 UT) tablet Take by mouth.   Cyanocobalamin 500 MCG LOZG Take 500 mcg by mouth daily.   ENTRESTO 24-26 MG TAKE 1 TABLET BY MOUTH TWICE DAILY   Ferrous Sulfate (IRON) 325 (65 Fe) MG TABS 1 tablet   Fluticasone-Umeclidin-Vilant (TRELEGY ELLIPTA) 100-62.5-25 MCG/INH AEPB 1 puff   gabapentin (NEURONTIN) 100 MG capsule 2 at 7 pm   levETIRAcetam (KEPPRA) 1000 MG tablet 1 q am and 1/2 q pm   omeprazole (PRILOSEC) 20 MG capsule 1  capsule   Pyridoxine HCl (VITAMIN B6) 100 MG TABS 1 tablet   nitroGLYCERIN (NITROSTAT) 0.4 MG SL tablet Place 1 tablet (0.4 mg total) under the tongue every 5 (five) minutes as needed for chest pain. If you require more than two tablets five minutes apart go to the nearest ER via EMS.   No facility-administered encounter medications on file as of 04/06/2021.    Allergies as of 04/06/2021 - Review Complete 04/06/2021  Allergen Reaction Noted   Lamotrigine Hives 01/30/2020   Oxycodone-aspirin  06/21/2019   Ace inhibitors Cough 06/21/2019   Oxycodone-acetaminophen Rash 06/21/2019    Past Medical History:  Diagnosis Date   CHF (congestive heart failure) (HCC)    COPD (chronic obstructive pulmonary disease) (HCC)    Coronary artery disease    Hyperlipidemia    Hypertension    NSTEMI (non-ST elevated myocardial infarction) (HCC)    OSA on CPAP    Oxygen dependent    Pulmonary hypertension (HCC)    Seizure (HCC)    Stroke [facility])     Past Surgical History:  Procedure Laterality Date   CARDIAC CATHETERIZATION     MASTECTOMY Bilateral    TUBAL LIGATION      Family History  Problem Relation Age of Onset   Heart attack Mother    Heart attack Father    Asthma Sister    Hyperlipidemia Sister    Hypertension Brother    Hyperlipidemia Sister     Social History   Socioeconomic History  Marital status: Single    Spouse name: Not on file   Number of children: 1   Years of education: Not on file   Highest education level: Not on file  Occupational History   Not on file  Tobacco Use   Smoking status: Former    Packs/day: 1.00    Years: 30.00    Pack years: 30.00    Types: Cigarettes    Start date: 27    Quit date: 09/21/2019    Years since quitting: 1.5   Smokeless tobacco: Never  Vaping Use   Vaping Use: Never used  Substance and Sexual Activity   Alcohol use: Never   Drug use: Never   Sexual activity: Not on file  Other Topics Concern   Not on file  Social  History Narrative   Not on file   Social Determinants of Health   Financial Resource Strain: Not on file  Food Insecurity: Not on file  Transportation Needs: Not on file  Physical Activity: Not on file  Stress: Not on file  Social Connections: Not on file  Intimate Partner Violence: Not on file    Review of Systems  Constitutional:  Negative for fever.  Respiratory:  Positive for shortness of breath.   Cardiovascular:  Negative for chest pain.  Psychiatric/Behavioral:  Positive for sleep disturbance.    Vitals:   04/06/21 1347  BP: 124/80  Pulse: (!) 55  Temp: 98.5 F (36.9 C)  SpO2: 98%     Physical Exam Constitutional:      Appearance: She is obese.  HENT:     Head: Normocephalic and atraumatic.     Nose: Nose normal.     Mouth/Throat:     Mouth: Mucous membranes are moist.  Eyes:     General:        Right eye: No discharge.        Left eye: No discharge.  Cardiovascular:     Rate and Rhythm: Normal rate and regular rhythm.     Pulses: Normal pulses.     Heart sounds: Normal heart sounds. No murmur heard. Pulmonary:     Effort: Pulmonary effort is normal. No respiratory distress.     Breath sounds: No stridor. No wheezing or rhonchi.  Musculoskeletal:     Cervical back: No rigidity or tenderness.  Neurological:     Mental Status: She is alert.  Psychiatric:        Mood and Affect: Mood normal.     Data Reviewed: Care everywhere visits reviewed  Notes by Dr. Celine Mans pulmonary from December 2021 reviewed  Echocardiogram shows diastolic dysfunction shows, ejection fraction of 64%  Assessment:  Pulmonary hypertension  Chronic obstructive pulmonary disease -Continue Trelegy -Prescription for albuterol with a spacer to be used as needed  Obstructive sleep apnea -Encouraged to continue CPAP on a regular basis  Chronic respiratory failure on oxygen supplementation -Continue oxygen supplementation  Deconditioning  Coronary artery  disease  Diastolic heart failure  Referral made for pulmonary rehab  Plan/Recommendations:  .  Graded exercise as tolerated  .  Continue CPAP therapy  .  Continue oxygen supplementation  .  Inhaler technique reiterated  .  Follow-up in 4 to 5 months     Virl Diamond MD [facility] 04/06/2021, 1:51 PM  CC: Georgann Housekeeper, MD

## 2021-04-07 ENCOUNTER — Encounter: Payer: Self-pay | Admitting: Podiatry

## 2021-04-07 ENCOUNTER — Other Ambulatory Visit: Payer: Self-pay

## 2021-04-07 ENCOUNTER — Ambulatory Visit (INDEPENDENT_AMBULATORY_CARE_PROVIDER_SITE_OTHER): Payer: Medicare Other | Admitting: Podiatry

## 2021-04-07 DIAGNOSIS — M79674 Pain in right toe(s): Secondary | ICD-10-CM | POA: Diagnosis not present

## 2021-04-07 DIAGNOSIS — M79675 Pain in left toe(s): Secondary | ICD-10-CM

## 2021-04-07 DIAGNOSIS — B351 Tinea unguium: Secondary | ICD-10-CM | POA: Diagnosis not present

## 2021-04-10 NOTE — Progress Notes (Signed)
Subjective: 65 y.o. returns the office today for painful, elongated, thickened toenails which she  cannot trim herself. Denies any redness or drainage around the nails.  She does report some cramping, dry skin.  No open sores.  She does get some denies any acute changes since last appointment and no new complaints today. Denies any systemic complaints such as fevers, chills, nausea, vomiting.   PCP: Georgann Housekeeper, MD  Objective: AAO 3, NAD DP/PT pulses palpable, CRT less than 3 seconds Nails hypertrophic, dystrophic, elongated, brittle, discolored 10. There is tenderness overlying the nails 1-5 bilaterally. There is no surrounding erythema or drainage along the nail sites. Dry skin is present with any open sores. No open lesions or pre-ulcerative lesions are identified. No other areas of tenderness bilateral lower extremities. No overlying edema, erythema, increased warmth. No, dry skin.  with calf compression, swelling, warmth, erythema.  Assessment: Patient presents with symptomatic onychomycosis  Plan: -Treatment options including alternatives, risks, complications were discussed -Nails sharply debrided 10 without complication/bleeding. -Moisturizer daily but do not apply interdigitally. -Discussed daily foot inspection. If there are any changes, to call the office immediately.  -Follow-up in 3 months or sooner if any problems are to arise. In the meantime, encouraged to call the office with any questions, concerns, changes symptoms.  Ovid Curd, DPM

## 2021-04-14 ENCOUNTER — Other Ambulatory Visit: Payer: Self-pay

## 2021-04-14 ENCOUNTER — Telehealth: Payer: Self-pay | Admitting: Neurology

## 2021-04-14 ENCOUNTER — Encounter: Payer: Self-pay | Admitting: Neurology

## 2021-04-14 ENCOUNTER — Ambulatory Visit (INDEPENDENT_AMBULATORY_CARE_PROVIDER_SITE_OTHER): Payer: Medicare Other | Admitting: Neurology

## 2021-04-14 VITALS — Ht 62.0 in | Wt 260.0 lb

## 2021-04-14 DIAGNOSIS — I639 Cerebral infarction, unspecified: Secondary | ICD-10-CM | POA: Diagnosis not present

## 2021-04-14 DIAGNOSIS — R479 Unspecified speech disturbances: Secondary | ICD-10-CM | POA: Insufficient documentation

## 2021-04-14 DIAGNOSIS — R4789 Other speech disturbances: Secondary | ICD-10-CM | POA: Diagnosis not present

## 2021-04-14 DIAGNOSIS — R569 Unspecified convulsions: Secondary | ICD-10-CM | POA: Diagnosis not present

## 2021-04-14 MED ORDER — LEVETIRACETAM 750 MG PO TABS: 750.0000 mg | ORAL_TABLET | Freq: Two times a day (BID) | ORAL | 4 refills | Status: DC

## 2021-04-14 NOTE — Progress Notes (Signed)
Chief Complaint  Patient presents with   New Patient (Initial Visit)    New paper referral:  Stroke hospital follow up/January 2021 Room 15, sister [name] in room       ASSESSMENT AND PLAN  Laurie Wall is a 65 y.o. female History of stroke in January 2021  Was treated at [facility], MRI reported borderline stroke involving left bilateral frontal, also temporal per report,  Ultrasound of carotid artery showed less than 40% stenosis of bilateral internal carotid artery, vertebral artery was not visible,  Multiple vascular risk factors, include hypertension, hyperlipidemia, longtime smoker, obstructive sleep apnea, COPD,  On aspirin 81 mg only, history of GI bleeding required blood transfusion in May 2021 Seizure  On Keppra 750 mg twice a day, last reported seizure was in January 2021  DIAGNOSTIC DATA (LABS, IMAGING, TESTING) - I reviewed patient records, labs, notes, testing and imaging myself where available.  Laboratory evaluations July 2021: Hg 11.1,  CMP creat 0.77, LDL 72, Hep C , HIV, negative,   March 2022, Keppra level 38.4, June 2021 38.3  MRI brain on Jan 26th 2021 at [facility] Multifocal regions of restricted diffusion with corresponding T2/FLAIR hyperintensity involving the cortex and subcortical white matter in the cerebral hemisphere, predominantly in the left frontal and parietal lobes with additional involvement of the left temporal lobe. There is ill-defined enhancement involving the affected left frontal and parietal regions (17:17, 20).   Dural based, T1/T2 isointense, homogeneously enhancing mass in the left frontal lobe measures 0.9 x 0.9 cm (17:18). No midline shift. No hydrocephalus. No extra-axial fluid collections are identified. No evidence of acute intracranial hemorrhage.    ECHO on August 2021    1. The left ventricle is normal in size with normal wall thickness.    2. The left ventricular systolic function is normal, LVEF is visually   estimated at 55-60%.    3. There is grade II diastolic dysfunction (elevated filling pressure).    4. The left atrium is mildly dilated in size.    5. The right ventricle is normal in size, with normal systolic function.    6. There is moderate pulmonary hypertension, estimated pulmonary artery  systolic pressure is 56 mmHg.    7. The right atrium is mildly dilated  in size.    US Carotid on Oct 03 2019: Right Carotid: There is evidence in the ICA of a less than 40% stenosis.                 Non-hemodynamically significant plaque noted in the CCA. Limited                 Study.  Left Carotid: There is evidence in the ICA of a less than 40% stenosis.                Non-hemodynamically significant plaque noted in the CCA. Limited                Study.  Vertebrals:  Bilateral vertebral arteries were not visualized.  Subclavians: Normal flow hemodynamics were seen in bilateral subclavian               arteries.   MEDICAL HISTORY:  Laurie Wall is a 65 year old female, seen in request by her primary care physician Dr. Georgann Housekeeper, for evaluation of stroke, seizure, initial evaluation was on April 14, 2021,  I reviewed and summarized the referring note. PMHX. HTN HLD. COPD, quit in Jan 2021, before that 1ppd, oxygen  dependent July 2021. OSA, CPAP, but not compliance with it.  Patient [facility] to live with her sister, she suffered a stroke in January 2021, suffered stroke, with residual right arm and leg weakness, reliant on a walker since then, also suffered aphasia, expressive more than comprehensive  Reviewed MRI of the brain report on October 02, 2019 at [facility], subacute appearing multiple focal left cerebral infarction, possible renal border zone distribution, involving left frontal, parietal lobe, with additional involvement of left temporal lobe,  Ultrasound of carotid artery showed less than 40% stenosis of bilateral internal carotid artery,  bilateral vertebral artery was not visualized  Sleep study on October 05, 2019, severe obstructive sleep apnea, associated with oxygen desaturation 77, I will also send description of sleep architecture, AHI 60/per hour  She also suffered GI bleeding, required blood transfusion in May 2021  She had a seizure following her stroke, has been taking Keppra now 750 mg twice a day, there was no recurrent seizure since,  Prior  to her stroke, she worked at a doctor's office   PHYSICAL EXAM:   Vitals:   04/14/21 0936  Weight: 260 lb (117.9 kg)  Height: 5\' 2"  (1.575 m)   Not recorded     Body mass index is 47.55 kg/m.  PHYSICAL EXAMNIATION:  Gen: NAD, conversant, well nourised, well groomed                     Cardiovascular: Regular rate rhythm, no peripheral edema, warm, nontender. Eyes: Conjunctivae clear without exudates or hemorrhage Neck: Supple, no carotid bruits. Pulmonary: Clear to auscultation bilaterally   NEUROLOGICAL EXAM:  MENTAL STATUS: Speech/cognition: Expressive morning comprehensive aphasia, rely on her sister to provide history    CRANIAL NERVES: CN II: Visual fields are full to confrontation. Pupils are round equal and briskly reactive to light. CN III, IV, VI: extraocular movement are normal. No ptosis. CN V: Facial sensation is intact to light touch CN VII: Right lower face weakness CN VIII: Hearing is normal to causal conversation. CN IX, X: Phonation is normal. CN XI: Head turning and shoulder shrug are intact  MOTOR: Spastic right hemiparesis involving upper and lower extremity  REFLEXES: Hyperreflexia right side  SENSORY: Intact to light touch, pinprick and vibratory sensation are intact in fingers and toes.  COORDINATION: There is no trunk or limb dysmetria noted.  GAIT/STANCE: Need assistance getting up, spastic right hand substantial unsteady gait  REVIEW OF SYSTEMS:  Full 14 system review of systems performed and notable only for as  above All other review of systems were negative.   ALLERGIES: Allergies  Allergen Reactions   Lamotrigine Hives   Oxycodone-Aspirin     Other reaction(s): rash   Ace Inhibitors Cough   Oxycodone-Acetaminophen Rash    Other reaction(s): rash    HOME MEDICATIONS: Current Outpatient Medications  Medication Sig Dispense Refill   albuterol (VENTOLIN HFA) 108 (90 Base) MCG/ACT inhaler Inhale 2 puffs into the lungs every 6 (six) hours as needed for wheezing or shortness of breath. 8 g 6   amLODipine (NORVASC) 10 MG tablet Take 10 mg by mouth daily.     aspirin 81 MG chewable tablet 1 tablet     atorvastatin (LIPITOR) 80 MG tablet Take 1 tablet by mouth daily.     carvedilol (COREG) 6.25 MG tablet TAKE 1 TABLET(6.25 MG) BY MOUTH TWICE DAILY WITH A MEAL 180 tablet 2   Cholecalciferol 25 MCG (1000 UT) tablet Take by mouth.  Cyanocobalamin 500 MCG LOZG Take 500 mcg by mouth daily.     ENTRESTO 24-26 MG TAKE 1 TABLET BY MOUTH TWICE DAILY 60 tablet 0   Ferrous Sulfate (IRON) 325 (65 Fe) MG TABS 1 tablet     Fluticasone-Umeclidin-Vilant (TRELEGY ELLIPTA) 100-62.5-25 MCG/INH AEPB 1 puff     gabapentin (NEURONTIN) 100 MG capsule 2 at 7 pm     levETIRAcetam (KEPPRA) 1000 MG tablet 1 q am and 1/2 q pm     omeprazole (PRILOSEC) 20 MG capsule 1 capsule     Pyridoxine HCl (VITAMIN B6) 100 MG TABS 1 tablet     chlorthalidone (HYGROTON) 25 MG tablet Take 12.5 mg by mouth every morning.     nitroGLYCERIN (NITROSTAT) 0.4 MG SL tablet Place 1 tablet (0.4 mg total) under the tongue every 5 (five) minutes as needed for chest pain. If you require more than two tablets five minutes apart go to the nearest ER via EMS. 30 tablet 0   omeprazole (PRILOSEC) 20 MG capsule Take by mouth.     No current facility-administered medications for this visit.    PAST MEDICAL HISTORY: Past Medical History:  Diagnosis Date   CHF (congestive heart failure) (HCC)    COPD (chronic obstructive pulmonary disease) (HCC)     Coronary artery disease    Hyperlipidemia    Hypertension    NSTEMI (non-ST elevated myocardial infarction) (HCC)    OSA on CPAP    Oxygen dependent    Pulmonary hypertension (HCC)    Seizure (HCC)    Stroke (HCC)     PAST SURGICAL HISTORY: Past Surgical History:  Procedure Laterality Date   CARDIAC CATHETERIZATION     MASTECTOMY Bilateral    TUBAL LIGATION      FAMILY HISTORY: Family History  Problem Relation Age of Onset   Heart attack Mother    Heart attack Father    Asthma Sister    Hyperlipidemia Sister    Hypertension Brother    Hyperlipidemia Sister     SOCIAL HISTORY: Social History   Socioeconomic History   Marital status: Single    Spouse name: Not on file   Number of children: 1   Years of education: Not on file   Highest education level: Not on file  Occupational History   Not on file  Tobacco Use   Smoking status: Former    Packs/day: 1.00    Years: 30.00    Pack years: 30.00    Types: Cigarettes    Start date: 821970    Quit date: 09/21/2019    Years since quitting: 1.5   Smokeless tobacco: Never  Vaping Use   Vaping Use: Never used  Substance and Sexual Activity   Alcohol use: Never   Drug use: Never   Sexual activity: Not on file  Other Topics Concern   Not on file  Social History Narrative   Lives alone   Right Handed   Drinks 1-2 cups caffeine daily    Social Determinants of Health   Financial Resource Strain: Not on file  Food Insecurity: Not on file  Transportation Needs: Not on file  Physical Activity: Not on file  Stress: Not on file  Social Connections: Not on file  Intimate Partner Violence: Not on file      Levert FeinsteinYijun Lulamae Skorupski, M.D. Ph.D.  [facility] [address], [address] [place], [place] [address] Ph: [phone] Fax: [phone]  CC:  Georgann HousekeeperHusain, Karrar, MD [address] [place] [address]  Georgann Housekeeper, MD

## 2021-04-14 NOTE — Telephone Encounter (Signed)
patient Laurie Wall medication chlorthalidone was removed from her medication lisiting per her sister. The print out from her visit still shows and she said she advised Dr. Terrace Arabia so her sister was questioning.

## 2021-04-19 ENCOUNTER — Other Ambulatory Visit: Payer: Self-pay | Admitting: Cardiology

## 2021-04-19 DIAGNOSIS — I5032 Chronic diastolic (congestive) heart failure: Secondary | ICD-10-CM

## 2021-04-20 ENCOUNTER — Encounter (HOSPITAL_COMMUNITY): Payer: Self-pay

## 2021-04-20 ENCOUNTER — Telehealth (HOSPITAL_COMMUNITY): Payer: Self-pay

## 2021-04-20 NOTE — Telephone Encounter (Signed)
Pt sister [name] returned PR phone call and stated pt is interested in [place]. Pt will come in for orientation on 9/30 at 1:30PM and will attend the 1:15PM class.   [name] would like the letter mailed to [id number], [place], [place] [address].

## 2021-04-20 NOTE — Telephone Encounter (Signed)
Attempted to contact pt in regards to Pulmonary Rehab. LM on VM  Mailed letter 

## 2021-04-24 DIAGNOSIS — I272 Pulmonary hypertension, unspecified: Secondary | ICD-10-CM | POA: Diagnosis not present

## 2021-04-25 ENCOUNTER — Encounter: Payer: Self-pay | Admitting: Neurology

## 2021-05-25 DIAGNOSIS — I272 Pulmonary hypertension, unspecified: Secondary | ICD-10-CM | POA: Diagnosis not present

## 2021-05-28 ENCOUNTER — Telehealth (HOSPITAL_COMMUNITY): Payer: Self-pay

## 2021-05-28 NOTE — Telephone Encounter (Signed)
Pt sister stated pt is not able to participate in the pulmonary rehab at this time, advised pt sister that if anything changes to call back. Closed referral.

## 2021-06-02 ENCOUNTER — Other Ambulatory Visit: Payer: Self-pay | Admitting: Cardiology

## 2021-06-02 DIAGNOSIS — I5032 Chronic diastolic (congestive) heart failure: Secondary | ICD-10-CM

## 2021-06-05 ENCOUNTER — Ambulatory Visit (HOSPITAL_COMMUNITY): Payer: Medicare Other

## 2021-06-07 ENCOUNTER — Emergency Department (HOSPITAL_COMMUNITY)
Admission: EM | Admit: 2021-06-07 | Discharge: 2021-06-07 | Disposition: A | Discharge status: Home / Self Care | Payer: Medicare Other | Attending: Emergency Medicine | Admitting: Emergency Medicine

## 2021-06-07 ENCOUNTER — Emergency Department (HOSPITAL_COMMUNITY): Payer: Medicare Other

## 2021-06-07 DIAGNOSIS — R0602 Shortness of breath: Secondary | ICD-10-CM | POA: Diagnosis not present

## 2021-06-07 DIAGNOSIS — J449 Chronic obstructive pulmonary disease, unspecified: Secondary | ICD-10-CM | POA: Insufficient documentation

## 2021-06-07 DIAGNOSIS — Z79899 Other long term (current) drug therapy: Secondary | ICD-10-CM | POA: Insufficient documentation

## 2021-06-07 DIAGNOSIS — Z7982 Long term (current) use of aspirin: Secondary | ICD-10-CM | POA: Diagnosis not present

## 2021-06-07 DIAGNOSIS — I509 Heart failure, unspecified: Secondary | ICD-10-CM | POA: Insufficient documentation

## 2021-06-07 DIAGNOSIS — I2510 Atherosclerotic heart disease of native coronary artery without angina pectoris: Secondary | ICD-10-CM | POA: Insufficient documentation

## 2021-06-07 DIAGNOSIS — I110 Hypertensive heart disease with heart failure: Secondary | ICD-10-CM | POA: Diagnosis not present

## 2021-06-07 DIAGNOSIS — Z87891 Personal history of nicotine dependence: Secondary | ICD-10-CM | POA: Insufficient documentation

## 2021-06-07 DIAGNOSIS — R0902 Hypoxemia: Secondary | ICD-10-CM | POA: Diagnosis not present

## 2021-06-07 LAB — CBC WITH DIFFERENTIAL/PLATELET
Abs Immature Granulocytes: 0.03 10*3/uL (ref 0.00–0.07)
Basophils Absolute: 0 10*3/uL (ref 0.0–0.1)
Basophils Relative: 0 %
Eosinophils Absolute: 0.1 10*3/uL (ref 0.0–0.5)
Eosinophils Relative: 1 %
HCT: 39.5 % (ref 36.0–46.0)
Hemoglobin: 12.3 g/dL (ref 12.0–15.0)
Immature Granulocytes: 0 %
Lymphocytes Relative: 18 %
Lymphs Abs: 1.4 10*3/uL (ref 0.7–4.0)
MCH: 27.3 pg (ref 26.0–34.0)
MCHC: 31.1 g/dL (ref 30.0–36.0)
MCV: 87.8 fL (ref 80.0–100.0)
Monocytes Absolute: 0.6 10*3/uL (ref 0.1–1.0)
Monocytes Relative: 8 %
Neutro Abs: 5.6 10*3/uL (ref 1.7–7.7)
Neutrophils Relative %: 73 %
Platelets: 277 10*3/uL (ref 150–400)
RBC: 4.5 MIL/uL (ref 3.87–5.11)
RDW: 14.8 % (ref 11.5–15.5)
WBC: 7.7 10*3/uL (ref 4.0–10.5)
nRBC: 0 % (ref 0.0–0.2)

## 2021-06-07 LAB — COMPREHENSIVE METABOLIC PANEL
ALT: 12 U/L (ref 0–44)
AST: 15 U/L (ref 15–41)
Albumin: 3.1 g/dL — ABNORMAL LOW (ref 3.5–5.0)
Alkaline Phosphatase: 78 U/L (ref 38–126)
Anion gap: 8 (ref 5–15)
BUN: 11 mg/dL (ref 8–23)
CO2: 29 mmol/L (ref 22–32)
Calcium: 9.1 mg/dL (ref 8.9–10.3)
Chloride: 99 mmol/L (ref 98–111)
Creatinine, Ser: 0.81 mg/dL (ref 0.44–1.00)
GFR, Estimated: >60 mL/min (ref >60)
Glucose, Bld: 91 mg/dL (ref 70–99)
Potassium: 4 mmol/L (ref 3.5–5.1)
Sodium: 136 mmol/L (ref 135–145)
Total Bilirubin: 0.7 mg/dL (ref 0.3–1.2)
Total Protein: 7.8 g/dL (ref 6.5–8.1)

## 2021-06-07 LAB — TROPONIN I (HIGH SENSITIVITY)
Troponin I (High Sensitivity): 7 ng/L (ref <18)
Troponin I (High Sensitivity): 9 ng/L (ref <18)

## 2021-06-07 LAB — BRAIN NATRIURETIC PEPTIDE: B Natriuretic Peptide: 137.5 pg/mL — ABNORMAL HIGH (ref 0.0–100.0)

## 2021-06-07 MED ORDER — ALBUTEROL SULFATE HFA 108 (90 BASE) MCG/ACT IN AERS
2.0000 | INHALATION_SPRAY | RESPIRATORY_TRACT | Status: DC | PRN
  Administered 2021-06-07: 2 via RESPIRATORY_TRACT
  Filled 2021-06-07: qty 6.7

## 2021-06-07 NOTE — ED Triage Notes (Signed)
Pt advised that she's usually on 2L but removed O2 when she came in to ED. O2 noted to be 85% in triage, placed on 2L Laurie Wall in triage.

## 2021-06-07 NOTE — ED Provider Notes (Signed)
[facility] EMERGENCY DEPARTMENT Provider Note   CSN: [id number] Arrival date & time: 06/07/21  1624     History Chief Complaint  Patient presents with   Shortness of Breath    Laurie Wall is a 65 y.o. female.  Patient is a 65 year old female with a history of CHF, COPD, hypertension, pulmonary hypertension, prior stroke and NSTEMI who is presenting today with complaint of shortness of breath.  Patient has 2 L of oxygen which she is supposed to be wearing continuously at home and reports that she was her normal self this morning when she woke up and went to church.  She was wearing her oxygen throughout Sunday school and she felt fine.  During the church service she did take her oxygen off because she felt like she did not need it.  When she went to walk out to her car after church she started feeling some tightness and restriction in her lungs like she could not get enough air in.  She was not having any chest pain during this time she denies any new weakness, syncope, cough, fever, sputum, abdominal pain, nausea or vomiting.  Because of this it going on her family was concerned and wanted her to be evaluated at the hospital.  When she got to the hospital she is not wearing her oxygen and O2 sats were 86%.  She was placed on her home 2 L and she reports that since she has been back on her oxygen all of her symptoms have resolved.  She thinks this is all related to not wearing her oxygen but she wanted to make sure everything was okay.  She continues to be compliant with her home medications.  She has had no new lower extremity swelling or pain.  The history is provided by the patient.  Shortness of Breath     Past Medical History:  Diagnosis Date   CHF (congestive heart failure) (HCC)    COPD (chronic obstructive pulmonary disease) (HCC)    Coronary artery disease    Hyperlipidemia    Hypertension    NSTEMI (non-ST elevated myocardial infarction) (HCC)    OSA on  CPAP    Oxygen dependent    Pulmonary hypertension (HCC)    Seizure (HCC)    Stroke [facility])     Patient Active Problem List   Diagnosis Date Noted   Cerebrovascular accident (CVA) (HCC) 04/14/2021   Seizures (HCC) 04/14/2021   Speech disturbance 04/14/2021   Altered mental status 09/28/2019   Hypertensive emergency 09/28/2019   NSTEMI (non-ST elevated myocardial infarction) (HCC) 09/28/2019   Seizure (HCC) 09/28/2019    Past Surgical History:  Procedure Laterality Date   CARDIAC CATHETERIZATION     MASTECTOMY Bilateral    TUBAL LIGATION       OB History   No obstetric history on file.     Family History  Problem Relation Age of Onset   Heart attack Mother    Heart attack Father    Asthma Sister    Hyperlipidemia Sister    Hypertension Brother    Hyperlipidemia Sister     Social History   Tobacco Use   Smoking status: Former    Packs/day: 1.00    Years: 30.00    Pack years: 30.00    Types: Cigarettes    Start date: 22    Quit date: 09/21/2019    Years since quitting: 1.7   Smokeless tobacco: Never  Vaping Use   Vaping Use: Never used  Substance Use Topics   Alcohol use: Never   Drug use: Never    Home Medications Prior to Admission medications   Medication Sig Start Date End Date Taking? Authorizing Provider  albuterol (VENTOLIN HFA) 108 (90 Base) MCG/ACT inhaler Inhale 2 puffs into the lungs every 6 (six) hours as needed for wheezing or shortness of breath. 04/06/21   Olalere, Onnie Boer A, MD  amLODipine (NORVASC) 10 MG tablet Take 10 mg by mouth daily. 08/11/20   [provider]  aspirin 81 MG chewable tablet 1 tablet    [provider]  atorvastatin (LIPITOR) 80 MG tablet Take 1 tablet by mouth daily. 10/09/19   [provider]  carvedilol (COREG) 6.25 MG tablet TAKE 1 TABLET(6.25 MG) BY MOUTH TWICE DAILY WITH A MEAL 03/23/21   Tolia, Sunit, DO  chlorthalidone (HYGROTON) 25 MG tablet Take 12.5 mg by mouth every morning. 03/06/21    [provider]  Cholecalciferol 25 MCG (1000 UT) tablet Take by mouth.    [provider]  Cyanocobalamin 500 MCG LOZG Take 500 mcg by mouth daily.    [provider]  ENTRESTO 24-26 MG TAKE 1 TABLET BY MOUTH TWICE DAILY 06/02/21   Tolia, Sunit, DO  Ferrous Sulfate (IRON) 325 (65 Fe) MG TABS 1 tablet    [provider]  Fluticasone-Umeclidin-Vilant (TRELEGY ELLIPTA) 100-62.5-25 MCG/INH AEPB 1 puff 09/25/20   [provider]  gabapentin (NEURONTIN) 100 MG capsule 2 at 7 pm 07/12/19   [provider]  levETIRAcetam (KEPPRA) 750 MG tablet Take 1 tablet (750 mg total) by mouth 2 (two) times daily. 04/14/21   Levert Feinstein, MD  nitroGLYCERIN (NITROSTAT) 0.4 MG SL tablet Place 1 tablet (0.4 mg total) under the tongue every 5 (five) minutes as needed for chest pain. If you require more than two tablets five minutes apart go to the nearest ER via EMS. 12/23/20 02/26/21  Tessa Lerner, DO  omeprazole (PRILOSEC) 20 MG capsule 1 capsule 04/01/20   [provider]  Pyridoxine HCl (VITAMIN B6) 100 MG TABS 1 tablet    [provider]    Allergies    Lamotrigine, Oxycodone-aspirin, Ace inhibitors, Other, and Oxycodone-acetaminophen  Review of Systems   Review of Systems  Respiratory:  Positive for shortness of breath.   All other systems reviewed and are negative.  Physical Exam Updated Vital Signs BP (!) 156/50   Pulse (!) 56   Temp 99 F (37.2 C) (Oral)   Resp 17   SpO2 100%   Physical Exam Vitals and nursing note reviewed.  Constitutional:      General: She is not in acute distress.    Appearance: She is well-developed.  HENT:     Head: Normocephalic and atraumatic.  Eyes:     Conjunctiva/sclera: Conjunctivae normal.     Pupils: Pupils are equal, round, and reactive to light.  Cardiovascular:     Rate and Rhythm: Normal rate and regular rhythm.     Pulses: Normal pulses.     Heart sounds: No murmur heard. Pulmonary:      Effort: Pulmonary effort is normal. No respiratory distress.     Breath sounds: Normal breath sounds. No wheezing or rales.     Comments: Globally decreased breath sounds throughout Abdominal:     General: There is no distension.     Palpations: Abdomen is soft.     Tenderness: There is no abdominal tenderness. There is no guarding or rebound.  Musculoskeletal:  General: No tenderness. Normal range of motion.     Cervical back: Normal range of motion and neck supple.     Right lower leg: No edema.     Left lower leg: No edema.     Comments: Skin changes on the lower extremities consistent with chronic venous stasis.  No wounds.  Skin:    General: Skin is warm and dry.     Findings: No erythema or rash.  Neurological:     Mental Status: She is alert and oriented to person, place, and time. Mental status is at baseline.     Motor: No weakness.     Comments: Repetitive stuttering  Psychiatric:        Mood and Affect: Mood normal.        Behavior: Behavior normal.        Thought Content: Thought content normal.    ED Results / Procedures / Treatments   Labs (all labs ordered are listed, but only abnormal results are displayed) Labs Reviewed  BRAIN NATRIURETIC PEPTIDE - Abnormal; Notable for the following components:      Result Value   B Natriuretic Peptide 137.5 (*)    All other components within normal limits  COMPREHENSIVE METABOLIC PANEL - Abnormal; Notable for the following components:   Albumin 3.1 (*)    All other components within normal limits  CBC WITH DIFFERENTIAL/PLATELET  TROPONIN I (HIGH SENSITIVITY)  TROPONIN I (HIGH SENSITIVITY)    EKG EKG Interpretation  Date/Time:  Sunday June 07 2021 16:35:59 EDT Ventricular Rate:  72 PR Interval:  200 QRS Duration: 110 QT Interval:  402 QTC Calculation: 440 R Axis:   74 Text Interpretation: Normal sinus rhythm Low voltage QRS Septal infarct , age undetermined ST & T wave abnormality, consider inferior  ischemia ST & T wave abnormality, consider anterior ischemia No previous tracing Confirmed by Gwyneth Sprout ([id number]) on 06/07/2021 5:42:10 PM  Radiology DG Chest 2 View  Result Date: 06/07/2021 CLINICAL DATA:  Shortness of breath. EXAM: CHEST - 2 VIEW COMPARISON:  None. FINDINGS: Enlarged cardiac contours. Contour abnormality in the region the right mediastinum. Bilateral interstitial pulmonary opacities. No pleural effusion. Osseous structures unremarkable. IMPRESSION: Contour abnormality within the region of the right mediastinum potentially dilated pulmonary artery. In the absence of priors, recommend correlation with contrast-enhanced chest CT to exclude hilar mass. Cardiomegaly and mild interstitial opacities favored to represent edema. Electronically Signed   By: Annia Belt M.D.   On: 06/07/2021 17:54    Procedures Procedures   Medications Ordered in ED Medications  albuterol (VENTOLIN HFA) 108 (90 Base) MCG/ACT inhaler 2 puff (2 puffs Inhalation Given 06/07/21 1850)    ED Course  I have reviewed the triage vital signs and the nursing notes.  Pertinent labs & imaging results that were available during my care of the patient were reviewed by me and considered in my medical decision making (see chart for details).    MDM Rules/Calculators/A&P                           Patient is a pleasant 65 year old female presenting today with complaints of shortness of breath.  Patient has a history of pulmonary hypertension, CHF and COPD.  She had the shortness of breath today after removing her oxygen while she was at church.  She reports that she sometimes takes it off because she feels like she does not need it.  However she felt very short of  breath and felt like she could not get enough air in.  Upon arrival here she was satting 86% on room air.  This resolved when she was placed on her 2 L of oxygen.  She reports all of her symptoms went away after she received the oxygen.  She just wanted  to make sure everything else was okay.  She is well-appearing on exam in the room.  She does not have excessive signs of fluid overload today.  She has been compliant with her medications.  Low suspicion for MI, pneumonia or PE.  BNP today is reassuring at 170.  Oxygen saturation on her 2 L is 100% on room air.  She does have globally decreased breath sounds but suspect that is her baseline.  EKG does show ST abnormality and intraventricular conduction delay however based on an EKG on 11/26/2020 findings are similar.  We will ensure no evidence of anemia, electrolyte abnormality or change in renal function.  Otherwise feel that patient will be stable for discharge.  9:29 PM Labs are reassuring.  Chest x-ray with cardiomegaly and mild interstitial opacities which she feels most likely baseline.  Contour abnormalities within the region of the right mediastinum could be related to pulmonary artery dilation patient does have a history of pulmonary hypertension.  Can follow-up with PCP about chest CT in the future to exclude a hilar mass.  MDM   Amount and/or Complexity of Data Reviewed Clinical lab tests: ordered and reviewed Tests in the radiology section of CPT: ordered and reviewed Tests in the medicine section of CPT: ordered and reviewed Independent visualization of images, tracings, or specimens: yes    Final Clinical Impression(s) / ED Diagnoses Final diagnoses:  Hypoxia    Rx / DC Orders ED Discharge Orders     None        Gwyneth Sprout, MD 06/07/21 2129

## 2021-06-07 NOTE — Discharge Instructions (Signed)
All your results look good today.  It will be important for you to follow-up with your cardiologist about your chest x-ray and if they think you need a CAT scan they can order it then.  Make sure you are wearing your oxygen all the time.  If you start having more shortness of breath while wearing your oxygen return to the emergency room.

## 2021-06-07 NOTE — ED Triage Notes (Signed)
Pt c/o [facility] after church. States that episode began & "usual oxygen might not have been enough." Pt unsure of how to adjust O2 as needed, advises that O2 need might have increased & unsure of how to respond.  Hx COPD, CHF, compliant w home meds

## 2021-06-07 NOTE — ED Provider Notes (Signed)
Emergency Medicine Provider Triage Evaluation Note  Laurie Wall , a 65 y.o. female with history of COPD and CHF was evaluated in triage.  Pt complains of shortness of breath today while at church.  Patient is supposed to be on at home oxygen at 2 L.  Patient reports she carried her compressor with her to church, but did not wear her oxygen.  Denies any fever, chest pain, any weakness, or lightheadedness.  Review of Systems  Positive: Shortness of breath Negative: Fever, chest pain, weakness, lightheadedness  Physical Exam  There were no vitals taken for this visit. Gen:   Awake, no distress   Resp:  Normal effort speaking in full sentences with ease.  No respiratory distress.  No tripoding, accessory muscle use, or cyanosis present. MSK:   Moves extremities without difficulty  Other: Trace pitting edema to bilateral lower extremities.  Medical Decision Making  Medically screening exam initiated at 4:38 PM.  Appropriate orders placed.  Laurie Wall was informed that the remainder of the evaluation will be completed by another provider, this initial triage assessment does not replace that evaluation, and the importance of remaining in the ED until their evaluation is complete.  Patient arrived not on home oxygen saturating 85% with no acute distress.  She was placed on her at home 2 L and return to 95%.   Achille Rich, PA-C 06/07/21 1645    Derwood Kaplan, MD 06/08/21 [phone]

## 2021-06-09 ENCOUNTER — Ambulatory Visit (HOSPITAL_COMMUNITY): Payer: Medicare Other

## 2021-06-11 ENCOUNTER — Ambulatory Visit (HOSPITAL_COMMUNITY): Payer: Medicare Other

## 2021-06-16 ENCOUNTER — Ambulatory Visit (HOSPITAL_COMMUNITY): Payer: Medicare Other

## 2021-06-18 ENCOUNTER — Ambulatory Visit (HOSPITAL_COMMUNITY): Payer: Medicare Other

## 2021-06-22 DIAGNOSIS — G4733 Obstructive sleep apnea (adult) (pediatric): Secondary | ICD-10-CM | POA: Diagnosis not present

## 2021-06-23 ENCOUNTER — Ambulatory Visit (HOSPITAL_COMMUNITY): Payer: Medicare Other

## 2021-06-24 DIAGNOSIS — I272 Pulmonary hypertension, unspecified: Secondary | ICD-10-CM | POA: Diagnosis not present

## 2021-06-25 ENCOUNTER — Ambulatory Visit (HOSPITAL_COMMUNITY): Payer: Medicare Other

## 2021-06-30 ENCOUNTER — Ambulatory Visit (HOSPITAL_COMMUNITY): Payer: Medicare Other

## 2021-07-01 DIAGNOSIS — E785 Hyperlipidemia, unspecified: Secondary | ICD-10-CM | POA: Diagnosis not present

## 2021-07-01 DIAGNOSIS — G4733 Obstructive sleep apnea (adult) (pediatric): Secondary | ICD-10-CM | POA: Diagnosis not present

## 2021-07-01 DIAGNOSIS — R569 Unspecified convulsions: Secondary | ICD-10-CM | POA: Diagnosis not present

## 2021-07-01 DIAGNOSIS — I10 Essential (primary) hypertension: Secondary | ICD-10-CM | POA: Diagnosis not present

## 2021-07-01 DIAGNOSIS — I872 Venous insufficiency (chronic) (peripheral): Secondary | ICD-10-CM | POA: Diagnosis not present

## 2021-07-01 DIAGNOSIS — Z8673 Personal history of transient ischemic attack (TIA), and cerebral infarction without residual deficits: Secondary | ICD-10-CM | POA: Diagnosis not present

## 2021-07-01 DIAGNOSIS — I5030 Unspecified diastolic (congestive) heart failure: Secondary | ICD-10-CM | POA: Diagnosis not present

## 2021-07-01 DIAGNOSIS — Z23 Encounter for immunization: Secondary | ICD-10-CM | POA: Diagnosis not present

## 2021-07-01 DIAGNOSIS — Z0000 Encounter for general adult medical examination without abnormal findings: Secondary | ICD-10-CM | POA: Diagnosis not present

## 2021-07-01 DIAGNOSIS — I2510 Atherosclerotic heart disease of native coronary artery without angina pectoris: Secondary | ICD-10-CM | POA: Diagnosis not present

## 2021-07-01 DIAGNOSIS — J9611 Chronic respiratory failure with hypoxia: Secondary | ICD-10-CM | POA: Diagnosis not present

## 2021-07-01 DIAGNOSIS — Z853 Personal history of malignant neoplasm of breast: Secondary | ICD-10-CM | POA: Diagnosis not present

## 2021-07-02 ENCOUNTER — Ambulatory Visit (HOSPITAL_COMMUNITY): Payer: Medicare Other

## 2021-07-07 ENCOUNTER — Ambulatory Visit (HOSPITAL_COMMUNITY): Payer: Medicare Other

## 2021-07-08 ENCOUNTER — Encounter (HOSPITAL_COMMUNITY): Payer: Self-pay | Admitting: Radiology

## 2021-07-09 ENCOUNTER — Ambulatory Visit (HOSPITAL_COMMUNITY): Payer: Medicare Other

## 2021-07-09 ENCOUNTER — Other Ambulatory Visit: Payer: Self-pay

## 2021-07-09 ENCOUNTER — Ambulatory Visit (INDEPENDENT_AMBULATORY_CARE_PROVIDER_SITE_OTHER): Payer: Medicare Other | Admitting: Podiatry

## 2021-07-09 DIAGNOSIS — M79674 Pain in right toe(s): Secondary | ICD-10-CM

## 2021-07-09 DIAGNOSIS — B351 Tinea unguium: Secondary | ICD-10-CM

## 2021-07-09 DIAGNOSIS — M79675 Pain in left toe(s): Secondary | ICD-10-CM | POA: Diagnosis not present

## 2021-07-12 NOTE — Progress Notes (Signed)
Subjective: 65 y.o. returns the office today for painful, elongated, thickened toenails which she  cannot trim herself. Denies any redness or drainage around the nails.  Denies any open lesions.  She has no other concerns today. Denies any systemic complaints such as fevers, chills.    PCP: Georgann Housekeeper, MD  Objective: AAO 3, NAD DP/PT pulses palpable, CRT less than 3 seconds Nails hypertrophic, dystrophic, elongated, brittle, discolored 10. There is tenderness overlying the nails 1-5 bilaterally. There is no surrounding erythema or drainage along the nail sites. Dry skin is present with any open sores. No open lesions or pre-ulcerative lesions are identified. No other areas of tenderness bilateral lower extremities. No overlying edema, erythema, increased warmth. No, dry skin.  with calf compression, swelling, warmth, erythema.  Assessment: Patient presents with symptomatic onychomycosis  Plan: -Treatment options including alternatives, risks, complications were discussed -Nails sharply debrided 10 without complication/bleeding.  At this point I do not think that topical medications can be helpful for her.  Given side effects of Lamisil would recommend holding off on that.  Recommend routine debridement of the nails. -Moisturizer daily but do not apply interdigitally. -Discussed daily foot inspection. If there are any changes, to call the office immediately.  -Follow-up in 3 months or sooner if any problems are to arise. In the meantime, encouraged to call the office with any questions, concerns, changes symptoms.  Ovid Curd, DPM

## 2021-07-14 ENCOUNTER — Ambulatory Visit (HOSPITAL_COMMUNITY): Payer: Medicare Other

## 2021-07-16 ENCOUNTER — Ambulatory Visit (HOSPITAL_COMMUNITY): Payer: Medicare Other

## 2021-07-21 ENCOUNTER — Ambulatory Visit (HOSPITAL_COMMUNITY): Payer: Medicare Other

## 2021-07-23 ENCOUNTER — Ambulatory Visit (HOSPITAL_COMMUNITY): Payer: Medicare Other

## 2021-07-25 DIAGNOSIS — I272 Pulmonary hypertension, unspecified: Secondary | ICD-10-CM | POA: Diagnosis not present

## 2021-07-28 ENCOUNTER — Ambulatory Visit (HOSPITAL_COMMUNITY): Payer: Medicare Other

## 2021-08-03 ENCOUNTER — Other Ambulatory Visit: Payer: Self-pay

## 2021-08-03 MED ORDER — AMLODIPINE BESYLATE 10 MG PO TABS: 10.0000 mg | ORAL_TABLET | Freq: Every day | ORAL | 3 refills | Status: DC

## 2021-08-03 MED ORDER — ATORVASTATIN CALCIUM 80 MG PO TABS: 80.0000 mg | ORAL_TABLET | Freq: Every day | ORAL | 3 refills | Status: DC

## 2021-08-04 ENCOUNTER — Ambulatory Visit (HOSPITAL_COMMUNITY): Payer: Medicare Other

## 2021-08-06 ENCOUNTER — Ambulatory Visit (HOSPITAL_COMMUNITY): Payer: Medicare Other

## 2021-08-14 ENCOUNTER — Ambulatory Visit: Payer: Medicare Other | Admitting: Pulmonary Disease

## 2021-08-18 ENCOUNTER — Other Ambulatory Visit: Payer: Self-pay

## 2021-08-18 ENCOUNTER — Ambulatory Visit (INDEPENDENT_AMBULATORY_CARE_PROVIDER_SITE_OTHER): Payer: Medicare Other | Admitting: Pulmonary Disease

## 2021-08-18 ENCOUNTER — Encounter: Payer: Self-pay | Admitting: Pulmonary Disease

## 2021-08-18 VITALS — BP 120/70 | HR 70 | Temp 98.8°F | Ht 62.0 in | Wt 260.0 lb

## 2021-08-18 DIAGNOSIS — G4733 Obstructive sleep apnea (adult) (pediatric): Secondary | ICD-10-CM | POA: Diagnosis not present

## 2021-08-18 DIAGNOSIS — M79604 Pain in right leg: Secondary | ICD-10-CM

## 2021-08-18 DIAGNOSIS — M79605 Pain in left leg: Secondary | ICD-10-CM

## 2021-08-18 DIAGNOSIS — R2681 Unsteadiness on feet: Secondary | ICD-10-CM | POA: Diagnosis not present

## 2021-08-18 DIAGNOSIS — Z9989 Dependence on other enabling machines and devices: Secondary | ICD-10-CM

## 2021-08-18 NOTE — Progress Notes (Signed)
Laurie Wall    [id number]    August 11, 1956  Primary Care Physician:Husain, Jerelyn Scott, MD  Referring Physician: Georgann Housekeeper, MD [address] [place],  [place] [address]  Chief complaint:   Patient is in for follow-up of shortness of breath, history of congestive heart failure, history of obstructive lung disease, history of obstructive sleep apnea Has been doing relatively well since last visit Accompanied by her sister who takes care of her  HPI:  Patient with a history of obstructive lung disease -Remains on bronchodilators -Continues to be compliant  History of obstructive sleep apnea -On CPAP therapy -She is having difficulty with the head [facility]  Referral for pulmonary rehab was made -Has been unable to do this as she needs assistance -Will consider home physical therapy if possible  History of pulmonary hypertension  History of coronary artery disease, multivessel coronary artery disease  History of CVA January 2021 History of seizures January 2021  She has been doing relatively well  She is trying better to become more active  Her sister is very involved in her care and helps her with activities and medications  She does have significant dysarthria  She continues on oxygen supplementation on a regular basis, compliant with CPAP  She relocated to [facility] from [facility]  Quit smoking about 21 months ago, 30-pack-year smoking history  No pertinent occupational history, no pets, no recent travel  Outpatient Encounter Medications as of 08/18/2021  Medication Sig   albuterol (VENTOLIN HFA) 108 (90 Base) MCG/ACT inhaler Inhale 2 puffs into the lungs every 6 (six) hours as needed for wheezing or shortness of breath.   amLODipine (NORVASC) 10 MG tablet Take 1 tablet (10 mg total) by mouth daily.   aspirin 81 MG chewable tablet 1 tablet   atorvastatin (LIPITOR) 80 MG tablet Take 1 tablet (80 mg total) by mouth daily.    carvedilol (COREG) 6.25 MG tablet TAKE 1 TABLET(6.25 MG) BY MOUTH TWICE DAILY WITH A MEAL   chlorthalidone (HYGROTON) 25 MG tablet Take 12.5 mg by mouth every morning.   Cholecalciferol 25 MCG (1000 UT) tablet Take by mouth.   Cyanocobalamin 500 MCG LOZG Take 500 mcg by mouth daily.   ENTRESTO 24-26 MG TAKE 1 TABLET BY MOUTH TWICE DAILY   Ferrous Sulfate (IRON) 325 (65 Fe) MG TABS 1 tablet   Fluticasone-Umeclidin-Vilant (TRELEGY ELLIPTA) 100-62.5-25 MCG/INH AEPB 1 puff   gabapentin (NEURONTIN) 100 MG capsule 2 at 7 pm   levETIRAcetam (KEPPRA) 750 MG tablet Take 1 tablet (750 mg total) by mouth 2 (two) times daily.   nitroGLYCERIN (NITROSTAT) 0.4 MG SL tablet Place 1 tablet (0.4 mg total) under the tongue every 5 (five) minutes as needed for chest pain. If you require more than two tablets five minutes apart go to the nearest ER via EMS.   omeprazole (PRILOSEC) 20 MG capsule 1 capsule   Pyridoxine HCl (VITAMIN B6) 100 MG TABS 1 tablet   No facility-administered encounter medications on file as of 08/18/2021.    Allergies as of 08/18/2021 - Review Complete 08/18/2021  Allergen Reaction Noted   Lamotrigine Hives 01/30/2020   Ace inhibitors Cough 06/21/2019   Other Rash 06/07/2021   Oxycodone-acetaminophen Rash 06/21/2019   Oxycodone-aspirin Rash 06/21/2019    Past Medical History:  Diagnosis Date   CHF (congestive heart failure) (HCC)    COPD (chronic obstructive pulmonary disease) (HCC)    Coronary artery disease    Hyperlipidemia  Hypertension    NSTEMI (non-ST elevated myocardial infarction) (HCC)    OSA on CPAP    Oxygen dependent    Pulmonary hypertension (HCC)    Seizure (HCC)    Stroke [facility])     Past Surgical History:  Procedure Laterality Date   CARDIAC CATHETERIZATION     MASTECTOMY Bilateral    TUBAL LIGATION      Family History  Problem Relation Age of Onset   Heart attack Mother    Heart attack Father    Asthma Sister    Hyperlipidemia Sister     Hypertension Brother    Hyperlipidemia Sister     Social History   Socioeconomic History   Marital status: Single    Spouse name: Not on file   Number of children: 1   Years of education: Not on file   Highest education level: Not on file  Occupational History   Not on file  Tobacco Use   Smoking status: Former    Packs/day: 1.00    Years: 30.00    Pack years: 30.00    Types: Cigarettes    Start date: 4    Quit date: 09/21/2019    Years since quitting: 1.9   Smokeless tobacco: Never  Vaping Use   Vaping Use: Never used  Substance and Sexual Activity   Alcohol use: Never   Drug use: Never   Sexual activity: Not on file  Other Topics Concern   Not on file  Social History Narrative   Lives alone   Right Handed   Drinks 1-2 cups caffeine daily    Social Determinants of Health   Financial Resource Strain: Not on file  Food Insecurity: Not on file  Transportation Needs: Not on file  Physical Activity: Not on file  Stress: Not on file  Social Connections: Not on file  Intimate Partner Violence: Not on file    Review of Systems  Constitutional:  Negative for fever.  Respiratory:  Positive for shortness of breath.   Cardiovascular:  Negative for chest pain.  Psychiatric/Behavioral:  Positive for sleep disturbance.    There were no vitals filed for this visit.    Physical Exam Constitutional:      Appearance: She is obese.  HENT:     Head: Normocephalic and atraumatic.     Nose: Nose normal.     Mouth/Throat:     Mouth: Mucous membranes are moist.  Eyes:     General:        Right eye: No discharge.        Left eye: No discharge.  Cardiovascular:     Rate and Rhythm: Normal rate and regular rhythm.     Pulses: Normal pulses.     Heart sounds: Normal heart sounds. No murmur heard. Pulmonary:     Effort: Pulmonary effort is normal. No respiratory distress.     Breath sounds: No stridor. No wheezing or rhonchi.  Musculoskeletal:     Cervical back: No  rigidity or tenderness.  Neurological:     Mental Status: She is alert.  Psychiatric:        Mood and Affect: Mood normal.     Data Reviewed: Care everywhere visits reviewed  Notes by Dr. Celine Mans pulmonary from December 2021 reviewed  Echocardiogram shows diastolic dysfunction shows, ejection fraction of 64%  Assessment:  Pulmonary hypertension -Continue current care  Chronic obstructive pulmonary disease -Continue Trelegy -Continue albuterol with spacer  Obstructive sleep apnea -Encouraged to continue CPAP on a  regular basis -DME referral for CPAP supplies-headgear  Chronic respiratory failure on oxygen supplementation -Continue oxygen supplementation  Deconditioning -We will try for home physical therapy  Coronary artery disease  Diastolic heart failure   Plan/Recommendations:  .  Continue CPAP therapy follow-up in about 3 months  .  Continue oxygen supplementation  .  Inhaler technique reiterated  .  Follow-up in about 3 months   Virl Diamond MD [facility] 08/18/2021, 2:22 PM  CC: Georgann Housekeeper, MD

## 2021-08-18 NOTE — Patient Instructions (Signed)
°  I will see you back in about 3 months  Continue regular exercises as you are doing already  We will send in the prescription for physical therapy at home  Contact medical supply company for CPAP supplies-headgear  Call with significant concerns

## 2021-08-20 ENCOUNTER — Telehealth: Payer: Self-pay | Admitting: Pulmonary Disease

## 2021-08-20 DIAGNOSIS — G4733 Obstructive sleep apnea (adult) (pediatric): Secondary | ICD-10-CM

## 2021-08-21 NOTE — Telephone Encounter (Signed)
Lm for patient's sister, Elaine(DPR).

## 2021-08-24 DIAGNOSIS — I272 Pulmonary hypertension, unspecified: Secondary | ICD-10-CM | POA: Diagnosis not present

## 2021-08-25 NOTE — Telephone Encounter (Signed)
Called and spoke with pt's sister [name] who had pt on another phone. Per both [name] and pt, pt is needing a new cpap mask as her current one is not fitting well. Stated to both that I would place an order for pt to be provided a new mask of pt's choice and they both verbalized understanding. Nothing further needed.

## 2021-09-01 ENCOUNTER — Ambulatory Visit: Payer: Medicare Other | Admitting: Cardiology

## 2021-09-02 DIAGNOSIS — I5032 Chronic diastolic (congestive) heart failure: Secondary | ICD-10-CM | POA: Diagnosis not present

## 2021-09-03 LAB — BASIC METABOLIC PANEL
BUN/Creatinine Ratio: 15 (ref 12–28)
BUN: 12 mg/dL (ref 8–27)
CO2: 27 mmol/L (ref 20–29)
Calcium: 9.6 mg/dL (ref 8.7–10.3)
Chloride: 100 mmol/L (ref 96–106)
Creatinine, Ser: 0.79 mg/dL (ref 0.57–1.00)
Glucose: 86 mg/dL (ref 70–99)
Potassium: 4.2 mmol/L (ref 3.5–5.2)
Sodium: 139 mmol/L (ref 134–144)
eGFR: 82 mL/min/{1.73_m2} (ref >59)

## 2021-09-03 LAB — PRO B NATRIURETIC PEPTIDE: NT-Pro BNP: 205 pg/mL (ref 0–301)

## 2021-09-03 LAB — MAGNESIUM: Magnesium: 2 mg/dL (ref 1.6–2.3)

## 2021-09-08 NOTE — Progress Notes (Signed)
Called patient, left message confirming upcoming appointment, on VM box.

## 2021-09-10 ENCOUNTER — Telehealth: Payer: Self-pay | Admitting: Pharmacist

## 2021-09-10 ENCOUNTER — Other Ambulatory Visit: Payer: Self-pay

## 2021-09-10 ENCOUNTER — Ambulatory Visit: Payer: Medicare Other | Admitting: Cardiology

## 2021-09-10 ENCOUNTER — Encounter: Payer: Self-pay | Admitting: Cardiology

## 2021-09-10 VITALS — BP 159/62 | HR 53 | Temp 97.7°F | Resp 16 | Ht 62.0 in | Wt 266.4 lb

## 2021-09-10 DIAGNOSIS — Z8249 Family history of ischemic heart disease and other diseases of the circulatory system: Secondary | ICD-10-CM | POA: Diagnosis not present

## 2021-09-10 DIAGNOSIS — G4733 Obstructive sleep apnea (adult) (pediatric): Secondary | ICD-10-CM

## 2021-09-10 DIAGNOSIS — Z9981 Dependence on supplemental oxygen: Secondary | ICD-10-CM

## 2021-09-10 DIAGNOSIS — I10 Essential (primary) hypertension: Secondary | ICD-10-CM | POA: Diagnosis not present

## 2021-09-10 DIAGNOSIS — Z9989 Dependence on other enabling machines and devices: Secondary | ICD-10-CM | POA: Diagnosis not present

## 2021-09-10 DIAGNOSIS — E66813 Obesity, class 3: Secondary | ICD-10-CM

## 2021-09-10 DIAGNOSIS — E782 Mixed hyperlipidemia: Secondary | ICD-10-CM | POA: Diagnosis not present

## 2021-09-10 DIAGNOSIS — I2510 Atherosclerotic heart disease of native coronary artery without angina pectoris: Secondary | ICD-10-CM

## 2021-09-10 DIAGNOSIS — I5032 Chronic diastolic (congestive) heart failure: Secondary | ICD-10-CM | POA: Diagnosis not present

## 2021-09-10 DIAGNOSIS — J449 Chronic obstructive pulmonary disease, unspecified: Secondary | ICD-10-CM | POA: Diagnosis not present

## 2021-09-10 DIAGNOSIS — I252 Old myocardial infarction: Secondary | ICD-10-CM | POA: Diagnosis not present

## 2021-09-10 DIAGNOSIS — Z8673 Personal history of transient ischemic attack (TIA), and cerebral infarction without residual deficits: Secondary | ICD-10-CM | POA: Diagnosis not present

## 2021-09-10 MED ORDER — ENTRESTO 24-26 MG PO TABS: 2.0000 | ORAL_TABLET | Freq: Two times a day (BID) | ORAL | 1 refills | Status: DC

## 2021-09-10 NOTE — Progress Notes (Signed)
ID:  Laurie Wall, DOB [birth year], MRN [id number]  PCP: Wenda Low, MD  Cardiologist:  Rex Kras, DO, [facility] (established care 11/18/2020) Former Cardiology Providers: Caren Hazy MD, Dr. Elenore Paddy    Date: 09/10/21 Last Office Visit: 02/26/2021.  Chief Complaint  Patient presents with   Coronary Artery Disease   heart failure management   Follow-up    HPI  Laurie Wall is a 66 y.o. female who presents to the office with a chief complaint of " 64-monthfollow-up for CAD and heart failure management." Patient's past medical history and cardiovascular risk factors include: History of non-STEMI, history of stroke, heart failure with preserved EF, three-vessel coronary artery disease, hyperlipidemia, hypertension, OSA on CPAP, pulmonary hypertension, COPD with oxygen dependence 2 L, postmenopausal female, advanced age, obesity.  She is referred to the office at the request of PMarvis Repress FNP to reestablish care with cardiology [place] [facility], CAD, stroke, pulmonary hypertension.  Patient is accompanied by her sister EMargaretha Sheffieldwho can be reached at [phone]  Patient provides verbal consent in regards to discussing her medical information in her presence.  [facility] and underwent left heart catheterization was found to have multivessel CAD.  However due to recent stroke the shared decision was to treat her medically with close follow-up.  [facility].  Patient's medications have been uptitrated in a stepwise fashion and she is doing well clinically.  Office blood pressure not well controlled but states that her home blood pressures are better with SBP ranging between 125-135 mmHg and DBP around 70 mmHg.  She denies orthopnea, paroxysmal nocturnal dyspnea  or lower extremity swelling.  No recent hospitalizations or urgent care visits for cardiovascular symptoms.  Family history of premature coronary disease but no sudden cardiac death.  Brother had a myocardial infarction at the age of 530  FUNCTIONAL STATUS: No structured exercise program or daily routine.   ALLERGIES: Allergies  Allergen Reactions   Lamotrigine Hives   Ace Inhibitors Cough   Other Rash    Percodan    Oxycodone-Acetaminophen Rash    Other reaction(s): rash Other reaction(s): rash   Oxycodone-Aspirin Rash    Other reaction(s): rash    MEDICATION LIST PRIOR TO VISIT: Current Meds  Medication Sig   albuterol (VENTOLIN HFA) 108 (90 Base) MCG/ACT inhaler Inhale 2 puffs into the lungs every 6 (six) hours as needed for wheezing or shortness of breath.   amLODipine (NORVASC) 10 MG tablet Take 1 tablet (10 mg total) by mouth daily.   aspirin 81 MG chewable tablet 1 tablet   atorvastatin (LIPITOR) 80 MG tablet Take 1 tablet (80 mg total) by mouth daily.   carvedilol (COREG) 6.25 MG tablet TAKE 1 TABLET(6.25 MG) BY MOUTH TWICE DAILY WITH A MEAL   Cholecalciferol 25 MCG (1000 UT) tablet Take by mouth.   Cyanocobalamin 500 MCG LOZG Take 500 mcg by mouth daily.   Ferrous Sulfate (IRON) 325 (65 Fe) MG TABS 1 tablet   Fluticasone-Umeclidin-Vilant (TRELEGY ELLIPTA) 100-62.5-25 MCG/INH AEPB 1 puff   gabapentin (NEURONTIN) 100 MG capsule 2 at 7 pm   levETIRAcetam (KEPPRA) 750 MG tablet Take 1 tablet (750 mg total) by mouth 2 (two) times daily.   nitroGLYCERIN (NITROSTAT) 0.4 MG SL tablet Place 1 tablet (0.4 mg total) under the tongue every 5 (  five) minutes as needed for chest pain. If you require more than two tablets five minutes apart go to the nearest ER via EMS.   omeprazole (PRILOSEC) 20 MG capsule 1 capsule   Pyridoxine HCl (VITAMIN B6) 100 MG TABS 1 tablet   [DISCONTINUED] chlorthalidone (HYGROTON) 25 MG tablet Take 12.5 mg by mouth every morning.   [DISCONTINUED] ENTRESTO  24-26 MG TAKE 1 TABLET BY MOUTH TWICE DAILY     PAST MEDICAL HISTORY: Past Medical History:  Diagnosis Date   CHF (congestive heart failure) (HCC)    COPD (chronic obstructive pulmonary disease) (HCC)    Coronary artery disease    Hyperlipidemia    Hypertension    NSTEMI (non-ST elevated myocardial infarction) (HCC)    OSA on CPAP    Oxygen dependent    Pulmonary hypertension (HCC)    Seizure ([place])    Stroke ([place])     PAST SURGICAL HISTORY: Past Surgical History:  Procedure Laterality Date   CARDIAC CATHETERIZATION     MASTECTOMY Bilateral    TUBAL LIGATION      FAMILY HISTORY: The patient family history includes Asthma in her sister; Heart attack in her father and mother; Hyperlipidemia in her sister and sister; Hypertension in her brother.  SOCIAL HISTORY:  The patient  reports that she quit smoking about 1 years ago. Her smoking use included cigarettes. She started smoking about 53 years ago. She has a 30.00 pack-year smoking history. She has never used smokeless tobacco. She reports that she does not drink alcohol and does not use drugs.  REVIEW OF SYSTEMS: Review of Systems  Constitutional: Negative for chills and fever.  HENT:  Negative for hoarse voice and nosebleeds.   Eyes:  Negative for discharge, double vision and pain.  Cardiovascular:  Negative for chest pain, claudication, dyspnea on exertion, leg swelling, near-syncope, orthopnea, palpitations, paroxysmal nocturnal dyspnea and syncope.  Respiratory:  Negative for hemoptysis and shortness of breath.   Musculoskeletal:  Negative for muscle cramps and myalgias.  Gastrointestinal:  Negative for abdominal pain, constipation, diarrhea, hematemesis, hematochezia, melena, nausea and vomiting.  Neurological:  Positive for seizures. Negative for dizziness and light-headedness.       Speaking difficulties (expressive aphasia)   PHYSICAL EXAM: Vitals with BMI 09/10/2021 09/10/2021 08/18/2021  Height - 5' 2"  5' 2"    Weight - 266 lbs 6 oz 260 lbs  BMI - 02.58 52.77  Systolic 824 235 361  Diastolic 62 57 70  Pulse 53 54 70    CONSTITUTIONAL: Appears older than stated age, hemodynamically stable, no acute distress.    SKIN: Skin is warm and dry. No rash noted. No cyanosis. No pallor. No jaundice HEAD: Normocephalic and atraumatic.  EYES: No scleral icterus MOUTH/THROAT: Moist oral membranes.  NECK: No JVD present. No thyromegaly noted. No carotid bruits  LYMPHATIC: No visible cervical adenopathy.  CHEST Normal respiratory effort. No intercostal retractions  LUNGS: Clear to auscultation bilaterally.  No stridor. No wheezes. No rales.  CARDIOVASCULAR: Regular, positive S1-S2, no murmurs rubs or gallops appreciated. ABDOMINAL: Obese, soft, nontender, nondistended, positive bowel sounds in all 4 quadrants, no apparent ascites.  EXTREMITIES: Trace bilateral peripheral edema, darker skin pigmentation to suggest chronic venous insufficiency, decreased bilateral DP and PT pulses. HEMATOLOGIC: No significant bruising NEUROLOGIC: Oriented to person, place, and time. Nonfocal. Normal muscle tone.  Walks with a walker. PSYCHIATRIC: Normal mood and affect. Normal behavior. Cooperative  CARDIAC DATABASE: EKG: 09/10/2021: Sinus bradycardia, 48 bpm, old anteroseptal infarct, without underlying injury pattern.  Echocardiogram: 12/30/2020: Left ventricle cavity is normal in size. Moderate concentric hypertrophy of the left ventricle. Normal global wall motion. Normal LV systolic function with EF 64%. Doppler evidence of grade II (pseudonormal) diastolic dysfunction, elevated LAP.  No significant valvular abnormality. Normal right atrial pressure.   Stress Testing: No results found for this or any previous visit from the past 1095 days.  Heart Catheterization: [facility] 10/04/2019: 1. Normal LV filling pressures with mean PCWP of 8 mm Hg and LVEDP of 11 mm Hg  2. Normal LV systolic function  3. No  significant renal artery stenosis  4. Multivessel coronary artery disease including 80% proximal LAD stenosis, 60% mid-circumflex stenosis, 50% OM1 stenosis, 60% OM2 stenosis and 95% mid-RCA stenosis  5. Pulmonary hypertension with PA pressure of 56/19 mm Hg with a mean of 32 mm Hg   LABORATORY DATA: CBC Latest Ref Rng & Units 06/07/2021  WBC 4.0 - 10.5 K/uL 7.7  Hemoglobin 12.0 - 15.0 g/dL 12.3  Hematocrit 36.0 - 46.0 % 39.5  Platelets 150 - 400 K/uL 277    CMP Latest Ref Rng & Units 09/02/2021 06/07/2021 12/30/2020  Glucose 70 - 99 mg/dL 86 91 82  BUN 8 - 27 mg/dL 12 11 15   Creatinine 0.57 - 1.00 mg/dL 0.79 0.81 0.92  Sodium 134 - 144 mmol/L 139 136 141  Potassium 3.5 - 5.2 mmol/L 4.2 4.0 3.9  Chloride 96 - 106 mmol/L 100 99 100  CO2 20 - 29 mmol/L 27 29 29   Calcium 8.7 - 10.3 mg/dL 9.6 9.1 9.5  Total Protein 6.5 - 8.1 g/dL - 7.8 -  Total Bilirubin 0.3 - 1.2 mg/dL - 0.7 -  Alkaline Phos 38 - 126 U/L - 78 -  AST 15 - 41 U/L - 15 -  ALT 0 - 44 U/L - 12 -    Lipid Panel  No results found for: CHOL, TRIG, HDL, CHOLHDL, VLDL, LDLCALC, LDLDIRECT, LABVLDL  No components found for: NTPROBNP Recent Labs    12/30/20 1448 09/02/21 1239  PROBNP 161 205   No results for input(s): TSH in the last 8760 hours.  BMP Recent Labs    12/30/20 1448 06/07/21 1744 09/02/21 1238  NA 141 136 139  K 3.9 4.0 4.2  CL 100 99 100  CO2 29 29 27   GLUCOSE 82 91 86  BUN 15 11 12   CREATININE 0.92 0.81 0.79  CALCIUM 9.5 9.1 9.6  GFRNONAA  --  >60  --     HEMOGLOBIN A1C No results found for: HGBA1C, MPG  External Labs: Collected: 11/13/2020 provided from care everywhere [facility]) Hemoglobin 12.9 g/dL, hematocrit 40.5% NT proBNP: 194 Creatinine 1 mg/dL. eGFR: 62 mL/min per 1.73 m Sodium 141, potassium 3.5, chloride 97, bicarb 35, AST 16, ALT 18, alkaline phosphatase 127 Lipid profile: Total cholesterol 154, triglycerides 75, HDL 57, LDL 82.  Hemoglobin A1c: 6.1 TSH:  1.53  IMPRESSION:    ICD-10-CM   1. Chronic heart failure with preserved ejection fraction (HCC)  I50.32 sacubitril-valsartan (ENTRESTO) 24-26 MG    Basic metabolic panel    Magnesium    Pro b natriuretic peptide (BNP)    2. Atherosclerosis of native coronary artery of native heart without angina pectoris  I25.10 EKG 12-Lead    3. History of non-ST elevation myocardial infarction (NSTEMI)  I25.2     4. Hx of stroke  Z86.73     5. Benign hypertension  I10     6. Mixed hyperlipidemia  E78.2     7. OSA on CPAP  G47.33    Z99.89     8. Chronic obstructive pulmonary disease, unspecified COPD type ([place])  J44.9     9. Oxygen dependent  Z99.81     10. Family history of premature CAD  Z82.49     80. Class 3 severe obesity due to excess calories with serious comorbidity and body mass index (BMI) of 45.0 to 49.9 in adult [facility])  E66.01    Z68.42        RECOMMENDATIONS: Nicie Milan is a 66 y.o. female whose past medical history and cardiac risk factors include: History of non-STEMI, history of stroke, heart failure with preserved EF, three-vessel coronary artery disease, hyperlipidemia, hypertension, OSA on CPAP, pulmonary hypertension, COPD with oxygen dependence 2 L, postmenopausal female, advanced age, obesity.  Chronic heart failure with preserved EF, stage C, NYHA class II: Overall euvolemic. Uptitrate Entresto to 49/51 mg p.o. twice daily.  Patient states that she just received 90day refills for Entresto 24/26 mg.   For now recommend taking 2 tablets of 24/26 mg twice a day. Blood work in 1 week to evaluate kidney function and electrolytes.   Stop chlorthalidone.   Will enroll her into principal care management for congestive heart failure until she is on appropriate GDMT. Labs from 09/03/2021 independently reviewed as a part of today's office visit.  Establish coronary artery disease without angina pectoris: Non-STEMI in January 2021 - noted to have multivessel CAD,  HFpEF and given the acute stroke was recommended to treat medically. Currently denies angina pectoris  No use of sublingual nitroglycerin tablet since last office encounter. Echo 12/2020: Preserved LVEF, grade 2 diastolic dysfunction, elevated LAP, no significant valvular heart disease. Monitor for now.  History of non-STEMI: Continue aspirin and statin therapy.  Patient prefers up titration of GDMT as discussed above  Benign essential hypertension: Office blood pressures not well controlled.  However, home blood pressures are better controlled, per patient. Medication changes as discussed above. Patient has been provided a blood pressure cuff as part of principal care management to monitor her ambulatory readings. Low-salt diet recommended, discussed DASH diet. Encourage medication compliance.  Hyperlipidemia: Continue statin therapy.  Most recent lipid profile independently reviewed and findings noted above.  History of stroke: Residual deficits include slurred speech.  Educated on the importance of secondary prevention. Continue aspirin and statin therapy.  COPD on 2 L nasal cannula: I have asked the patient to consider seeing pulmonary medicine to reestablish care given her underlying COPD and sleep apnea.  Will defer further management to primary/pulmonary medicine.   FINAL MEDICATION LIST END OF ENCOUNTER: Meds ordered this encounter  Medications   sacubitril-valsartan (ENTRESTO) 24-26 MG    Sig: Take 2 tablets by mouth 2 (two) times daily.    Dispense:  180 tablet    Refill:  1     Medications Discontinued During This Encounter  Medication Reason   ENTRESTO 24-26 MG    chlorthalidone (HYGROTON) 25 MG tablet Change in therapy      Current Outpatient Medications:    albuterol (VENTOLIN HFA) 108 (90 Base) MCG/ACT inhaler, Inhale 2 puffs into the lungs every 6 (six) hours as needed for wheezing or shortness of breath., Disp: 8 g, Rfl: 6   amLODipine (NORVASC) 10 MG  tablet, Take 1 tablet (10 mg total) by mouth daily., Disp: 90 tablet, Rfl: 3   aspirin 81 MG chewable tablet, 1 tablet, Disp: , Rfl:    atorvastatin (LIPITOR)  80 MG tablet, Take 1 tablet (80 mg total) by mouth daily., Disp: 90 tablet, Rfl: 3   carvedilol (COREG) 6.25 MG tablet, TAKE 1 TABLET(6.25 MG) BY MOUTH TWICE DAILY WITH A MEAL, Disp: 180 tablet, Rfl: 2   Cholecalciferol 25 MCG (1000 UT) tablet, Take by mouth., Disp: , Rfl:    Cyanocobalamin 500 MCG LOZG, Take 500 mcg by mouth daily., Disp: , Rfl:    Ferrous Sulfate (IRON) 325 (65 Fe) MG TABS, 1 tablet, Disp: , Rfl:    Fluticasone-Umeclidin-Vilant (TRELEGY ELLIPTA) 100-62.5-25 MCG/INH AEPB, 1 puff, Disp: , Rfl:    gabapentin (NEURONTIN) 100 MG capsule, 2 at 7 pm, Disp: , Rfl:    levETIRAcetam (KEPPRA) 750 MG tablet, Take 1 tablet (750 mg total) by mouth 2 (two) times daily., Disp: 180 tablet, Rfl: 4   nitroGLYCERIN (NITROSTAT) 0.4 MG SL tablet, Place 1 tablet (0.4 mg total) under the tongue every 5 (five) minutes as needed for chest pain. If you require more than two tablets five minutes apart go to the nearest ER via EMS., Disp: 30 tablet, Rfl: 0   omeprazole (PRILOSEC) 20 MG capsule, 1 capsule, Disp: , Rfl:    Pyridoxine HCl (VITAMIN B6) 100 MG TABS, 1 tablet, Disp: , Rfl:    sacubitril-valsartan (ENTRESTO) 24-26 MG, Take 2 tablets by mouth 2 (two) times daily., Disp: 180 tablet, Rfl: 1  Orders Placed This Encounter  Procedures   Basic metabolic panel   Magnesium   Pro b natriuretic peptide (BNP)   EKG 12-Lead   There are no Patient Instructions on file for this visit.   --Continue cardiac medications as reconciled in final medication list. --Return in about 4 weeks (around 10/08/2021) for Follow up, heart failure management.. Or sooner if needed. --Continue follow-up with your primary care physician regarding the management of your other chronic comorbid conditions.  Patient's questions and concerns were addressed to her  satisfaction. She voices understanding of the instructions provided during this encounter.   This note was created using a voice recognition software as a result there may be grammatical errors inadvertently enclosed that do not reflect the nature of this encounter. Every attempt is made to correct such errors.   Rex Kras, [place], [facility]  Pager: [phone] Office: [phone]

## 2021-09-10 NOTE — Telephone Encounter (Signed)
CARE PLAN ENTRY  09/10/2021 Name: Laurie Wall MRN: [id number] DOB: 1956-07-25  Laurie Wall is enrolled in Remote Patient Monitoring/Principle Care Monitoring.  Date of Enrollment: 09/10/21 Supervising physician: Tessa Lerner Indication: HTN and CHF  Remote Readings:  New Start  Next scheduled OV: 10/08/21  Pharmacist Clinical Goal(s):  Over the next 90 days, patient will demonstrate Improved medication adherence as evidenced by medication fill history Over the next 90 days, patient will demonstrate improved understanding of prescribed medications and rationale for usage as evidenced by patient teach back Over the next 90 days, patient will experience decrease in ED visits. ED visits in last 6 months = 1 Over the next 90 days, patient will not experience hospital admission. Hospital Admissions in last 6 months = 0  Interventions: Provider and Inter-disciplinary care team collaboration (see longitudinal plan of care) Comprehensive medication review performed. Discussed plans with patient for ongoing care management follow up and provided patient with direct contact information for care management team Collaboration with provider re: medication management  Patient Self Care Activities:  Self administers medications as prescribed Attends all scheduled provider appointments Performs ADL's independently Performs IADL's independently  Allergies  Allergen Reactions   Lamotrigine Hives   Ace Inhibitors Cough   Other Rash    Percodan    Oxycodone-Acetaminophen Rash    Other reaction(s): rash Other reaction(s): rash   Oxycodone-Aspirin Rash    Other reaction(s): rash   Outpatient Encounter Medications as of 09/10/2021  Medication Sig   albuterol (VENTOLIN HFA) 108 (90 Base) MCG/ACT inhaler Inhale 2 puffs into the lungs every 6 (six) hours as needed for wheezing or shortness of breath.   amLODipine (NORVASC) 10 MG tablet Take 1 tablet (10 mg total) by mouth daily.    aspirin 81 MG chewable tablet 1 tablet   atorvastatin (LIPITOR) 80 MG tablet Take 1 tablet (80 mg total) by mouth daily.   carvedilol (COREG) 6.25 MG tablet TAKE 1 TABLET(6.25 MG) BY MOUTH TWICE DAILY WITH A MEAL   Cholecalciferol 25 MCG (1000 UT) tablet Take by mouth.   Cyanocobalamin 500 MCG LOZG Take 500 mcg by mouth daily.   Ferrous Sulfate (IRON) 325 (65 Fe) MG TABS 1 tablet   Fluticasone-Umeclidin-Vilant (TRELEGY ELLIPTA) 100-62.5-25 MCG/INH AEPB 1 puff   gabapentin (NEURONTIN) 100 MG capsule 2 at 7 pm   levETIRAcetam (KEPPRA) 750 MG tablet Take 1 tablet (750 mg total) by mouth 2 (two) times daily.   nitroGLYCERIN (NITROSTAT) 0.4 MG SL tablet Place 1 tablet (0.4 mg total) under the tongue every 5 (five) minutes as needed for chest pain. If you require more than two tablets five minutes apart go to the nearest ER via EMS.   omeprazole (PRILOSEC) 20 MG capsule 1 capsule   Pyridoxine HCl (VITAMIN B6) 100 MG TABS 1 tablet   sacubitril-valsartan (ENTRESTO) 24-26 MG Take 2 tablets by mouth 2 (two) times daily.   No facility-administered encounter medications on file as of 09/10/2021.    Hypertension   BP goal is:  <130/80  Office blood pressures are  BP Readings from Last 3 Encounters:  09/10/21 (!) 159/62  08/18/21 120/70  06/07/21 (!) 148/45    Patient is currently uncontrolled on the following medications: Entresto 49/51 mg BID, amlodipine 10 mg, carvedilol 6.25 mg BID  Patient checks BP at home daily  Patient has tried  these meds in the past: chlorthalidone 12.5 mg,   We discussed diet and exercise extensively and weighing daily; if you gain more than 3  pounds in one day or 5 pounds in one week call your doctor  Plan  Continue current medications and control with diet and exercise   Reviewed with pt and pt's sister regarding monitoring home BP readings following recent Entresto dose increase. Reviewed with pt the importance of staying compliant to her medications and  monitoring her vitals at home. Reviewed limiting salt intake, ensuring adequate fluid intake of ~2L/day, and monitoring her daily vitals. Pt current lives independently at a senior living facility. Pt working with PCP to get home health services to help with her ADLs. Currently on 2L of [place] at home. Provided pt and caregiver with direct phone number to reach if she has any questions or concerns.    ______________ Visit Information SDOH (Social Determinants of Health) assessments performed: Yes.  Ms. Boerema was given information about Principle Care Management/Remote Patient Monitoring services today including:  RPM/PCM service includes personalized support from designated clinical staff supervised by her physician, including individualized plan of care and coordination with other care providers 24/7 contact phone numbers for assistance for urgent and routine care needs. Standard insurance, coinsurance, copays and deductibles apply for principle care management only during months in which we provide at least 30 minutes of these services. Most insurances cover these services at 100%, however patients may be responsible for any copay, coinsurance and/or deductible if applicable. This service may help you avoid the need for more expensive face-to-face services. Only one practitioner may furnish and bill the service in a calendar month. The patient may stop PCM/RPM services at any time (effective at the end of the month) by phone call to the office staff.  Patient agreed to services and verbal consent obtained.   Cassell Clement, Pharm.D. Clinical Pharmacist [facility] [phone] Ext: [phone]

## 2021-09-15 ENCOUNTER — Telehealth: Payer: Self-pay | Admitting: Pulmonary Disease

## 2021-09-15 DIAGNOSIS — I110 Hypertensive heart disease with heart failure: Secondary | ICD-10-CM | POA: Diagnosis not present

## 2021-09-15 DIAGNOSIS — Z7951 Long term (current) use of inhaled steroids: Secondary | ICD-10-CM | POA: Diagnosis not present

## 2021-09-15 DIAGNOSIS — I272 Pulmonary hypertension, unspecified: Secondary | ICD-10-CM | POA: Diagnosis not present

## 2021-09-15 DIAGNOSIS — I252 Old myocardial infarction: Secondary | ICD-10-CM | POA: Diagnosis not present

## 2021-09-15 DIAGNOSIS — Z87891 Personal history of nicotine dependence: Secondary | ICD-10-CM | POA: Diagnosis not present

## 2021-09-15 DIAGNOSIS — Z8673 Personal history of transient ischemic attack (TIA), and cerebral infarction without residual deficits: Secondary | ICD-10-CM | POA: Diagnosis not present

## 2021-09-15 DIAGNOSIS — I2510 Atherosclerotic heart disease of native coronary artery without angina pectoris: Secondary | ICD-10-CM | POA: Diagnosis not present

## 2021-09-15 DIAGNOSIS — E785 Hyperlipidemia, unspecified: Secondary | ICD-10-CM | POA: Diagnosis not present

## 2021-09-15 DIAGNOSIS — Z9989 Dependence on other enabling machines and devices: Secondary | ICD-10-CM | POA: Diagnosis not present

## 2021-09-15 DIAGNOSIS — R5381 Other malaise: Secondary | ICD-10-CM | POA: Diagnosis not present

## 2021-09-15 DIAGNOSIS — J441 Chronic obstructive pulmonary disease with (acute) exacerbation: Secondary | ICD-10-CM | POA: Diagnosis not present

## 2021-09-15 DIAGNOSIS — Z7982 Long term (current) use of aspirin: Secondary | ICD-10-CM | POA: Diagnosis not present

## 2021-09-15 DIAGNOSIS — Z9981 Dependence on supplemental oxygen: Secondary | ICD-10-CM | POA: Diagnosis not present

## 2021-09-15 DIAGNOSIS — G4733 Obstructive sleep apnea (adult) (pediatric): Secondary | ICD-10-CM | POA: Diagnosis not present

## 2021-09-15 DIAGNOSIS — I5030 Unspecified diastolic (congestive) heart failure: Secondary | ICD-10-CM | POA: Diagnosis not present

## 2021-09-15 NOTE — Telephone Encounter (Signed)
Started home health care. Would like orders for pt twice a week for two weeks, once a week for twice weeks and then every other week for four weeks. Also wants nursing eval for congestive heart fail. Needs to prescritpions for beatric rollator (seated walker) and bed side commode. Fax: [phone]

## 2021-09-16 NOTE — Telephone Encounter (Signed)
We can place order for the physical therapy  She should contact her primary care doctor for orders regarding evaluation for heart failure and DME supplies

## 2021-09-16 NOTE — Telephone Encounter (Signed)
Called and spoke with Vernona Rieger who stated that pt has started home health care. Vernona Rieger states she needs verbal orders for pt to have therapy twice a week for two weeks, then once a week for two weeks, and then every other week for four weeks. Vernona Rieger also stated that she wants nursing eval for congestive heart failure and also states that she needs written scripts for pt to receive a beatric rollator (seated walker) and bed side commode which will need to be faxed to [phone].  Dr. Val Eagle, please advise if you are okay with all this being done.

## 2021-09-17 DIAGNOSIS — I611 Nontraumatic intracerebral hemorrhage in hemisphere, cortical: Secondary | ICD-10-CM | POA: Diagnosis not present

## 2021-09-17 DIAGNOSIS — I214 Non-ST elevation (NSTEMI) myocardial infarction: Secondary | ICD-10-CM | POA: Diagnosis not present

## 2021-09-17 DIAGNOSIS — M6281 Muscle weakness (generalized): Secondary | ICD-10-CM | POA: Diagnosis not present

## 2021-09-17 DIAGNOSIS — G4733 Obstructive sleep apnea (adult) (pediatric): Secondary | ICD-10-CM | POA: Diagnosis not present

## 2021-09-18 DIAGNOSIS — G4733 Obstructive sleep apnea (adult) (pediatric): Secondary | ICD-10-CM | POA: Diagnosis not present

## 2021-09-18 DIAGNOSIS — Z9981 Dependence on supplemental oxygen: Secondary | ICD-10-CM | POA: Diagnosis not present

## 2021-09-18 DIAGNOSIS — I272 Pulmonary hypertension, unspecified: Secondary | ICD-10-CM | POA: Diagnosis not present

## 2021-09-18 DIAGNOSIS — I110 Hypertensive heart disease with heart failure: Secondary | ICD-10-CM | POA: Diagnosis not present

## 2021-09-18 DIAGNOSIS — Z8673 Personal history of transient ischemic attack (TIA), and cerebral infarction without residual deficits: Secondary | ICD-10-CM | POA: Diagnosis not present

## 2021-09-18 DIAGNOSIS — I5030 Unspecified diastolic (congestive) heart failure: Secondary | ICD-10-CM | POA: Diagnosis not present

## 2021-09-18 DIAGNOSIS — Z9989 Dependence on other enabling machines and devices: Secondary | ICD-10-CM | POA: Diagnosis not present

## 2021-09-18 DIAGNOSIS — J441 Chronic obstructive pulmonary disease with (acute) exacerbation: Secondary | ICD-10-CM | POA: Diagnosis not present

## 2021-09-18 DIAGNOSIS — Z87891 Personal history of nicotine dependence: Secondary | ICD-10-CM | POA: Diagnosis not present

## 2021-09-18 DIAGNOSIS — Z7951 Long term (current) use of inhaled steroids: Secondary | ICD-10-CM | POA: Diagnosis not present

## 2021-09-18 DIAGNOSIS — R5381 Other malaise: Secondary | ICD-10-CM | POA: Diagnosis not present

## 2021-09-18 DIAGNOSIS — E785 Hyperlipidemia, unspecified: Secondary | ICD-10-CM | POA: Diagnosis not present

## 2021-09-18 DIAGNOSIS — I2510 Atherosclerotic heart disease of native coronary artery without angina pectoris: Secondary | ICD-10-CM | POA: Diagnosis not present

## 2021-09-18 DIAGNOSIS — Z7982 Long term (current) use of aspirin: Secondary | ICD-10-CM | POA: Diagnosis not present

## 2021-09-18 DIAGNOSIS — I252 Old myocardial infarction: Secondary | ICD-10-CM | POA: Diagnosis not present

## 2021-09-21 ENCOUNTER — Other Ambulatory Visit: Payer: Self-pay | Admitting: Cardiology

## 2021-09-21 DIAGNOSIS — Z7982 Long term (current) use of aspirin: Secondary | ICD-10-CM | POA: Diagnosis not present

## 2021-09-21 DIAGNOSIS — G4733 Obstructive sleep apnea (adult) (pediatric): Secondary | ICD-10-CM | POA: Diagnosis not present

## 2021-09-21 DIAGNOSIS — I2510 Atherosclerotic heart disease of native coronary artery without angina pectoris: Secondary | ICD-10-CM | POA: Diagnosis not present

## 2021-09-21 DIAGNOSIS — I272 Pulmonary hypertension, unspecified: Secondary | ICD-10-CM | POA: Diagnosis not present

## 2021-09-21 DIAGNOSIS — R5381 Other malaise: Secondary | ICD-10-CM | POA: Diagnosis not present

## 2021-09-21 DIAGNOSIS — J441 Chronic obstructive pulmonary disease with (acute) exacerbation: Secondary | ICD-10-CM | POA: Diagnosis not present

## 2021-09-21 DIAGNOSIS — I252 Old myocardial infarction: Secondary | ICD-10-CM | POA: Diagnosis not present

## 2021-09-21 DIAGNOSIS — Z8673 Personal history of transient ischemic attack (TIA), and cerebral infarction without residual deficits: Secondary | ICD-10-CM | POA: Diagnosis not present

## 2021-09-21 DIAGNOSIS — Z9989 Dependence on other enabling machines and devices: Secondary | ICD-10-CM | POA: Diagnosis not present

## 2021-09-21 DIAGNOSIS — E785 Hyperlipidemia, unspecified: Secondary | ICD-10-CM | POA: Diagnosis not present

## 2021-09-21 DIAGNOSIS — I110 Hypertensive heart disease with heart failure: Secondary | ICD-10-CM | POA: Diagnosis not present

## 2021-09-21 DIAGNOSIS — Z7951 Long term (current) use of inhaled steroids: Secondary | ICD-10-CM | POA: Diagnosis not present

## 2021-09-21 DIAGNOSIS — I5032 Chronic diastolic (congestive) heart failure: Secondary | ICD-10-CM | POA: Diagnosis not present

## 2021-09-21 DIAGNOSIS — Z9981 Dependence on supplemental oxygen: Secondary | ICD-10-CM | POA: Diagnosis not present

## 2021-09-21 DIAGNOSIS — Z87891 Personal history of nicotine dependence: Secondary | ICD-10-CM | POA: Diagnosis not present

## 2021-09-21 DIAGNOSIS — I5030 Unspecified diastolic (congestive) heart failure: Secondary | ICD-10-CM | POA: Diagnosis not present

## 2021-09-22 LAB — BASIC METABOLIC PANEL
BUN/Creatinine Ratio: 20 (ref 12–28)
BUN: 16 mg/dL (ref 8–27)
CO2: 29 mmol/L (ref 20–29)
Calcium: 9.8 mg/dL (ref 8.7–10.3)
Chloride: 102 mmol/L (ref 96–106)
Creatinine, Ser: 0.81 mg/dL (ref 0.57–1.00)
Glucose: 103 mg/dL — ABNORMAL HIGH (ref 70–99)
Potassium: 4.7 mmol/L (ref 3.5–5.2)
Sodium: 143 mmol/L (ref 134–144)
eGFR: 80 mL/min/{1.73_m2} (ref >59)

## 2021-09-22 LAB — PRO B NATRIURETIC PEPTIDE: NT-Pro BNP: 119 pg/mL (ref 0–301)

## 2021-09-22 LAB — MAGNESIUM: Magnesium: 1.8 mg/dL (ref 1.6–2.3)

## 2021-09-23 DIAGNOSIS — I2510 Atherosclerotic heart disease of native coronary artery without angina pectoris: Secondary | ICD-10-CM | POA: Diagnosis not present

## 2021-09-23 DIAGNOSIS — Z8673 Personal history of transient ischemic attack (TIA), and cerebral infarction without residual deficits: Secondary | ICD-10-CM | POA: Diagnosis not present

## 2021-09-23 DIAGNOSIS — E785 Hyperlipidemia, unspecified: Secondary | ICD-10-CM | POA: Diagnosis not present

## 2021-09-23 DIAGNOSIS — J441 Chronic obstructive pulmonary disease with (acute) exacerbation: Secondary | ICD-10-CM | POA: Diagnosis not present

## 2021-09-23 DIAGNOSIS — Z7982 Long term (current) use of aspirin: Secondary | ICD-10-CM | POA: Diagnosis not present

## 2021-09-23 DIAGNOSIS — I272 Pulmonary hypertension, unspecified: Secondary | ICD-10-CM | POA: Diagnosis not present

## 2021-09-23 DIAGNOSIS — R5381 Other malaise: Secondary | ICD-10-CM | POA: Diagnosis not present

## 2021-09-23 DIAGNOSIS — I252 Old myocardial infarction: Secondary | ICD-10-CM | POA: Diagnosis not present

## 2021-09-23 DIAGNOSIS — Z87891 Personal history of nicotine dependence: Secondary | ICD-10-CM | POA: Diagnosis not present

## 2021-09-23 DIAGNOSIS — I5030 Unspecified diastolic (congestive) heart failure: Secondary | ICD-10-CM | POA: Diagnosis not present

## 2021-09-23 DIAGNOSIS — I110 Hypertensive heart disease with heart failure: Secondary | ICD-10-CM | POA: Diagnosis not present

## 2021-09-23 DIAGNOSIS — G4733 Obstructive sleep apnea (adult) (pediatric): Secondary | ICD-10-CM | POA: Diagnosis not present

## 2021-09-23 DIAGNOSIS — Z7951 Long term (current) use of inhaled steroids: Secondary | ICD-10-CM | POA: Diagnosis not present

## 2021-09-23 DIAGNOSIS — Z9989 Dependence on other enabling machines and devices: Secondary | ICD-10-CM | POA: Diagnosis not present

## 2021-09-23 DIAGNOSIS — Z9981 Dependence on supplemental oxygen: Secondary | ICD-10-CM | POA: Diagnosis not present

## 2021-09-24 DIAGNOSIS — I272 Pulmonary hypertension, unspecified: Secondary | ICD-10-CM | POA: Diagnosis not present

## 2021-09-25 ENCOUNTER — Other Ambulatory Visit: Payer: Self-pay

## 2021-09-25 DIAGNOSIS — I5032 Chronic diastolic (congestive) heart failure: Secondary | ICD-10-CM

## 2021-09-29 ENCOUNTER — Telehealth: Payer: Self-pay | Admitting: Pulmonary Disease

## 2021-09-29 DIAGNOSIS — I110 Hypertensive heart disease with heart failure: Secondary | ICD-10-CM | POA: Diagnosis not present

## 2021-09-29 DIAGNOSIS — Z9981 Dependence on supplemental oxygen: Secondary | ICD-10-CM | POA: Diagnosis not present

## 2021-09-29 DIAGNOSIS — I252 Old myocardial infarction: Secondary | ICD-10-CM | POA: Diagnosis not present

## 2021-09-29 DIAGNOSIS — I2510 Atherosclerotic heart disease of native coronary artery without angina pectoris: Secondary | ICD-10-CM | POA: Diagnosis not present

## 2021-09-29 DIAGNOSIS — Z9989 Dependence on other enabling machines and devices: Secondary | ICD-10-CM | POA: Diagnosis not present

## 2021-09-29 DIAGNOSIS — I5030 Unspecified diastolic (congestive) heart failure: Secondary | ICD-10-CM | POA: Diagnosis not present

## 2021-09-29 DIAGNOSIS — Z7982 Long term (current) use of aspirin: Secondary | ICD-10-CM | POA: Diagnosis not present

## 2021-09-29 DIAGNOSIS — Z8673 Personal history of transient ischemic attack (TIA), and cerebral infarction without residual deficits: Secondary | ICD-10-CM | POA: Diagnosis not present

## 2021-09-29 DIAGNOSIS — R5381 Other malaise: Secondary | ICD-10-CM | POA: Diagnosis not present

## 2021-09-29 DIAGNOSIS — J441 Chronic obstructive pulmonary disease with (acute) exacerbation: Secondary | ICD-10-CM | POA: Diagnosis not present

## 2021-09-29 DIAGNOSIS — G4733 Obstructive sleep apnea (adult) (pediatric): Secondary | ICD-10-CM | POA: Diagnosis not present

## 2021-09-29 DIAGNOSIS — Z87891 Personal history of nicotine dependence: Secondary | ICD-10-CM | POA: Diagnosis not present

## 2021-09-29 DIAGNOSIS — I272 Pulmonary hypertension, unspecified: Secondary | ICD-10-CM | POA: Diagnosis not present

## 2021-09-29 DIAGNOSIS — E785 Hyperlipidemia, unspecified: Secondary | ICD-10-CM | POA: Diagnosis not present

## 2021-09-29 DIAGNOSIS — Z7951 Long term (current) use of inhaled steroids: Secondary | ICD-10-CM | POA: Diagnosis not present

## 2021-09-29 NOTE — Telephone Encounter (Signed)
Spoke with the pt's home health nurse  She states that she checked pt's pulse today and it was 51  Pt feeling well and reported to her that this was normal  I advised she should let pt's PCP or cardiologist know   Nothing further needed

## 2021-10-02 NOTE — Progress Notes (Signed)
No answer left a vm to call back

## 2021-10-05 NOTE — Progress Notes (Signed)
Patient is aware of results.

## 2021-10-07 ENCOUNTER — Telehealth: Payer: Self-pay | Admitting: Pulmonary Disease

## 2021-10-07 DIAGNOSIS — Z7951 Long term (current) use of inhaled steroids: Secondary | ICD-10-CM | POA: Diagnosis not present

## 2021-10-07 DIAGNOSIS — Z9981 Dependence on supplemental oxygen: Secondary | ICD-10-CM | POA: Diagnosis not present

## 2021-10-07 DIAGNOSIS — I110 Hypertensive heart disease with heart failure: Secondary | ICD-10-CM | POA: Diagnosis not present

## 2021-10-07 DIAGNOSIS — I272 Pulmonary hypertension, unspecified: Secondary | ICD-10-CM | POA: Diagnosis not present

## 2021-10-07 DIAGNOSIS — M79604 Pain in right leg: Secondary | ICD-10-CM

## 2021-10-07 DIAGNOSIS — I2510 Atherosclerotic heart disease of native coronary artery without angina pectoris: Secondary | ICD-10-CM | POA: Diagnosis not present

## 2021-10-07 DIAGNOSIS — Z8673 Personal history of transient ischemic attack (TIA), and cerebral infarction without residual deficits: Secondary | ICD-10-CM | POA: Diagnosis not present

## 2021-10-07 DIAGNOSIS — Z87891 Personal history of nicotine dependence: Secondary | ICD-10-CM | POA: Diagnosis not present

## 2021-10-07 DIAGNOSIS — I5030 Unspecified diastolic (congestive) heart failure: Secondary | ICD-10-CM | POA: Diagnosis not present

## 2021-10-07 DIAGNOSIS — J441 Chronic obstructive pulmonary disease with (acute) exacerbation: Secondary | ICD-10-CM | POA: Diagnosis not present

## 2021-10-07 DIAGNOSIS — E785 Hyperlipidemia, unspecified: Secondary | ICD-10-CM | POA: Diagnosis not present

## 2021-10-07 DIAGNOSIS — G4733 Obstructive sleep apnea (adult) (pediatric): Secondary | ICD-10-CM | POA: Diagnosis not present

## 2021-10-07 DIAGNOSIS — I252 Old myocardial infarction: Secondary | ICD-10-CM | POA: Diagnosis not present

## 2021-10-07 DIAGNOSIS — R5381 Other malaise: Secondary | ICD-10-CM | POA: Diagnosis not present

## 2021-10-07 DIAGNOSIS — Z9989 Dependence on other enabling machines and devices: Secondary | ICD-10-CM | POA: Diagnosis not present

## 2021-10-07 DIAGNOSIS — R2681 Unsteadiness on feet: Secondary | ICD-10-CM

## 2021-10-07 DIAGNOSIS — Z7982 Long term (current) use of aspirin: Secondary | ICD-10-CM | POA: Diagnosis not present

## 2021-10-07 NOTE — Telephone Encounter (Signed)
Spoke with [name] She states that pt had eval by PT and that they rec that she needs a large rollator and bedside commode  She wants order sent to [facility] medical  Please advise if this is okay to send order

## 2021-10-07 NOTE — Telephone Encounter (Signed)
Spoke with the pt's home health nurse, Vernona Rieger  They currently have instructions to have to call the office here every time her pulse is below 60  Pt runs usually in the 50's as her baseline  She is asking if they can change this to call us only if it gets below 50  Please advise if this is okay, thanks!

## 2021-10-08 ENCOUNTER — Other Ambulatory Visit: Payer: Self-pay

## 2021-10-08 ENCOUNTER — Ambulatory Visit: Payer: Medicare Other | Admitting: Cardiology

## 2021-10-08 ENCOUNTER — Encounter: Payer: Self-pay | Admitting: Cardiology

## 2021-10-08 VITALS — BP 146/54 | HR 53 | Temp 97.2°F | Resp 16 | Ht 62.0 in | Wt 265.0 lb

## 2021-10-08 DIAGNOSIS — Z789 Other specified health status: Secondary | ICD-10-CM

## 2021-10-08 DIAGNOSIS — I5032 Chronic diastolic (congestive) heart failure: Secondary | ICD-10-CM | POA: Diagnosis not present

## 2021-10-08 DIAGNOSIS — J449 Chronic obstructive pulmonary disease, unspecified: Secondary | ICD-10-CM | POA: Diagnosis not present

## 2021-10-08 DIAGNOSIS — I10 Essential (primary) hypertension: Secondary | ICD-10-CM | POA: Diagnosis not present

## 2021-10-08 DIAGNOSIS — Z6841 Body Mass Index (BMI) 40.0 and over, adult: Secondary | ICD-10-CM

## 2021-10-08 DIAGNOSIS — Z9981 Dependence on supplemental oxygen: Secondary | ICD-10-CM | POA: Diagnosis not present

## 2021-10-08 DIAGNOSIS — E782 Mixed hyperlipidemia: Secondary | ICD-10-CM | POA: Diagnosis not present

## 2021-10-08 DIAGNOSIS — G4733 Obstructive sleep apnea (adult) (pediatric): Secondary | ICD-10-CM | POA: Diagnosis not present

## 2021-10-08 DIAGNOSIS — I2510 Atherosclerotic heart disease of native coronary artery without angina pectoris: Secondary | ICD-10-CM | POA: Diagnosis not present

## 2021-10-08 DIAGNOSIS — Z8673 Personal history of transient ischemic attack (TIA), and cerebral infarction without residual deficits: Secondary | ICD-10-CM

## 2021-10-08 DIAGNOSIS — I252 Old myocardial infarction: Secondary | ICD-10-CM | POA: Diagnosis not present

## 2021-10-08 MED ORDER — ROSUVASTATIN CALCIUM 20 MG PO TABS: 20.0000 mg | ORAL_TABLET | Freq: Every day | ORAL | 0 refills | Status: DC

## 2021-10-08 MED ORDER — DAPAGLIFLOZIN PROPANEDIOL 10 MG PO TABS: 10.0000 mg | ORAL_TABLET | Freq: Every day | ORAL | 0 refills | Status: DC

## 2021-10-08 NOTE — Progress Notes (Signed)
ID:  Laurie Wall, DOB [birth year], MRN [id number]  PCP: Wenda Low, MD  Cardiologist:  Rex Kras, DO, [facility] (established care 11/18/2020) Former Cardiology Providers: Laurie Hazy MD, Dr. Elenore Paddy    Date: 10/08/21 Last Office Visit: 09/10/2021  Chief Complaint  Patient presents with   heart failure management   Follow-up    HPI  Laurie Wall is a 66 y.o. female who presents to the office with a chief complaint of " follow-up for CAD and heart failure management." Patient's past medical history and cardiovascular risk factors include: History of non-STEMI, history of stroke, heart failure with preserved EF, three-vessel coronary artery disease, hyperlipidemia, hypertension, OSA on CPAP, pulmonary hypertension, COPD with oxygen dependence 2 L, postmenopausal female, advanced age, obesity.  She is referred to the office at the request of Laurie Repress, FNP to reestablish care with cardiology after moving to [facility] from [place], [place] given her history of non-STEMI, CAD, stroke, pulmonary hypertension.  Patient is accompanied by her sister Laurie Wall who can be reached at [phone].  Patient provides verbal consent in regards to discussing her medical information in her presence.  In January 2021 she presented to [facility] diagnosed with non-STEMI and underwent left heart catheterization and was noted to have multivessel CAD.  She also had a stroke and therefore the shared decision was to treat her CAD medically with close follow-up.  Since then she moved to [facility] and establish care with our practice in March 2022.  Her medications have been uptitrated in a stepwise fashion.  For close monitoring preventing hospitalization she was enrolled in the personal care management where her blood pressure has been monitored carefully and management of her chronic comorbid conditions.  Patient is average blood pressure over the last 2 weeks is 137/67 with a heart rate of 52.   At the last office visit patient was requested uptitrate Entresto to 24/26 mg 2 tablets twice a day.  Repeat blood work 09/21/2021 notes stable renal function.  Clinically she denies angina pectoris or heart failure symptoms.  Family history of premature coronary disease but no sudden cardiac death.  Brother had a myocardial infarction at the age of 20.  FUNCTIONAL STATUS: No structured exercise program or daily routine.   ALLERGIES: Allergies  Allergen Reactions   Lamotrigine Hives   Ace Inhibitors Cough   Other Rash    Percodan    Oxycodone-Acetaminophen Rash    Other reaction(s): rash Other reaction(s): rash   Oxycodone-Aspirin Rash    Other reaction(s): rash    MEDICATION LIST PRIOR TO VISIT: Current Meds  Medication Sig   albuterol (VENTOLIN HFA) 108 (90 Base) MCG/ACT inhaler Inhale 2 puffs into the lungs every 6 (six) hours as needed for wheezing or shortness of breath.   aspirin 81 MG chewable tablet 1 tablet   carvedilol (COREG) 6.25 MG tablet TAKE 1 TABLET(6.25 MG) BY MOUTH TWICE DAILY WITH A MEAL   Cholecalciferol 25 MCG (1000 UT) tablet Take by mouth.   Cyanocobalamin 500 MCG LOZG Take 500 mcg by mouth daily.   dapagliflozin propanediol (FARXIGA) 10 MG TABS tablet Take 1 tablet (10 mg total) by mouth daily before breakfast.   Ferrous Sulfate (IRON) 325 (65 Fe) MG TABS 1 tablet   Fluticasone-Umeclidin-Vilant (TRELEGY ELLIPTA) 100-62.5-25 MCG/INH AEPB 1 puff   gabapentin (NEURONTIN) 100 MG capsule 2 at 7 pm   levETIRAcetam (KEPPRA) 750 MG tablet Take 1 tablet (750 mg total) by mouth 2 (two) times daily.   omeprazole (PRILOSEC) 20  MG capsule 1 capsule   Pyridoxine HCl (VITAMIN B6) 100 MG TABS 1 tablet   rosuvastatin (CRESTOR) 20 MG tablet Take 1 tablet (20 mg total) by mouth at bedtime.   sacubitril-valsartan (ENTRESTO) 24-26 MG Take 2 tablets by mouth 2 (two) times daily.   [DISCONTINUED] amLODipine (NORVASC) 10 MG tablet Take 1 tablet (10 mg total) by mouth daily.    [DISCONTINUED] atorvastatin (LIPITOR) 80 MG tablet Take 1 tablet (80 mg total) by mouth daily.     PAST MEDICAL HISTORY: Past Medical History:  Diagnosis Date   CHF (congestive heart failure) (HCC)    COPD (chronic obstructive pulmonary disease) (HCC)    Coronary artery disease    Hyperlipidemia    Hypertension    NSTEMI (non-ST elevated myocardial infarction) ([place])    OSA on CPAP    Oxygen dependent    Pulmonary hypertension (HCC)    Seizure ([place])    Stroke ([place])     PAST SURGICAL HISTORY: Past Surgical History:  Procedure Laterality Date   CARDIAC CATHETERIZATION     MASTECTOMY Bilateral    TUBAL LIGATION      FAMILY HISTORY: The patient family history includes Asthma in her sister; Heart attack in her father and mother; Hyperlipidemia in her sister and sister; Hypertension in her brother.  SOCIAL HISTORY:  The patient  reports that she quit smoking about 2 years ago. Her smoking use included cigarettes. She started smoking about 53 years ago. She has a 30.00 pack-year smoking history. She has never used smokeless tobacco. She reports that she does not drink alcohol and does not use drugs.  REVIEW OF SYSTEMS: Review of Systems  Constitutional: Negative for chills and fever.  HENT:  Negative for hoarse voice and nosebleeds.   Eyes:  Negative for discharge, double vision and pain.  Cardiovascular:  Negative for chest pain, claudication, dyspnea on exertion, leg swelling, near-syncope, orthopnea, palpitations, paroxysmal nocturnal dyspnea and syncope.  Respiratory:  Negative for hemoptysis and shortness of breath.   Musculoskeletal:  Positive for arthritis and muscle cramps. Negative for myalgias.  Gastrointestinal:  Negative for abdominal pain, constipation, diarrhea, hematemesis, hematochezia, melena, nausea and vomiting.  Neurological:  Positive for seizures. Negative for dizziness and light-headedness.       Speaking difficulties (expressive aphasia)   PHYSICAL  EXAM: Vitals with BMI 10/08/2021 09/10/2021 09/10/2021  Height 5' 2" - 5' 2"  Weight 265 lbs - 266 lbs 6 oz  BMI 01.65 - 53.74  Systolic 827 078 675  Diastolic 54 62 57  Pulse 53 53 54    CONSTITUTIONAL: Appears older than stated age, hemodynamically stable, no acute distress.    SKIN: Skin is warm and dry. No rash noted. No cyanosis. No pallor. No jaundice HEAD: Normocephalic and atraumatic.  EYES: No scleral icterus MOUTH/THROAT: Moist oral membranes.  NECK: No JVD present. No thyromegaly noted. No carotid bruits  LYMPHATIC: No visible cervical adenopathy.  CHEST Normal respiratory effort. No intercostal retractions  LUNGS: Clear to auscultation bilaterally.  No stridor. No wheezes. No rales.  CARDIOVASCULAR: Regular, positive S1-S2, no murmurs rubs or gallops appreciated. ABDOMINAL: Obese, soft, nontender, nondistended, positive bowel sounds in all 4 quadrants, no apparent ascites.  EXTREMITIES: Trace bilateral peripheral edema, darker skin pigmentation to suggest chronic venous insufficiency, decreased bilateral DP and PT pulses. HEMATOLOGIC: No significant bruising NEUROLOGIC: Oriented to person, place, and time. Nonfocal. Normal muscle tone.  Walks with a walker. PSYCHIATRIC: Normal mood and affect. Normal behavior. Cooperative  CARDIAC DATABASE: EKG: 09/10/2021:  Sinus bradycardia, 48 bpm, old anteroseptal infarct, without underlying injury pattern.  Echocardiogram: 12/30/2020: Left ventricle cavity is normal in size. Moderate concentric hypertrophy of the left ventricle. Normal global wall motion. Normal LV systolic function with EF 64%. Doppler evidence of grade II (pseudonormal) diastolic dysfunction, elevated LAP.  No significant valvular abnormality. Normal right atrial pressure.   Stress Testing: No results found for this or any previous visit from the past 1095 days.  Heart Catheterization: [facility] 10/04/2019: 1. Normal LV filling pressures with mean PCWP of 8 mm  Hg and LVEDP of 11 mm Hg  2. Normal LV systolic function  3. No significant renal artery stenosis  4. Multivessel coronary artery disease including 80% proximal LAD stenosis, 60% mid-circumflex stenosis, 50% OM1 stenosis, 60% OM2 stenosis and 95% mid-RCA stenosis  5. Pulmonary hypertension with PA pressure of 56/19 mm Hg with a mean of 32 mm Hg   LABORATORY DATA: CBC Latest Ref Rng & Units 06/07/2021  WBC 4.0 - 10.5 K/uL 7.7  Hemoglobin 12.0 - 15.0 g/dL 12.3  Hematocrit 36.0 - 46.0 % 39.5  Platelets 150 - 400 K/uL 277    CMP Latest Ref Rng & Units 09/21/2021 09/02/2021 06/07/2021  Glucose 70 - 99 mg/dL 103(H) 86 91  BUN 8 - 27 mg/dL _0 Creatinine 0.57 - 1.00 mg/dL 0.81 0.79 0.81  Sodium 134 - 144 mmol/L 143 139 136  Potassium 3.5 - 5.2 mmol/L 4.7 4.2 4.0  Chloride 96 - 106 mmol/L 102 100 99  CO2 20 - 29 mmol/L _1 Calcium 8.7 - 10.3 mg/dL 9.8 9.6 9.1  Total Protein 6.5 - 8.1 g/dL - - 7.8  Total Bilirubin 0.3 - 1.2 mg/dL - - 0.7  Alkaline Phos 38 - 126 U/L - - 78  AST 15 - 41 U/L - - 15  ALT 0 - 44 U/L - - 12    Lipid Panel  No results found for: CHOL, TRIG, HDL, CHOLHDL, VLDL, LDLCALC, LDLDIRECT, LABVLDL  No components found for: NTPROBNP Recent Labs    12/30/20 1448 09/02/21 1239 09/21/21 1300  PROBNP 161 205 119   No results for input(s): TSH in the last 8760 hours.  BMP Recent Labs    06/07/21 1744 09/02/21 1238 09/21/21 1300  NA 136 139 143  K 4.0 4.2 4.7  CL 99 100 102  CO2 _2 GLUCOSE 91 86 103*  BUN _3 CREATININE 0.81 0.79 0.81  CALCIUM 9.1 9.6 9.8  GFRNONAA >60  --   --     HEMOGLOBIN A1C No results found for: HGBA1C, MPG  External Labs: Collected: 11/13/2020 provided from care everywhere [facility]) Hemoglobin 12.9 g/dL, hematocrit 40.5% NT proBNP: 194 Creatinine 1 mg/dL. eGFR: 62 mL/min per 1.73 m Sodium 141, potassium 3.5, chloride 97, bicarb 35, AST 16, ALT 18, alkaline phosphatase 127 Lipid profile:  Total cholesterol 154, triglycerides 75, HDL 57, LDL 82.  Hemoglobin A1c: 6.1 TSH: 1.53  IMPRESSION:    ICD-10-CM   1. Chronic heart failure with preserved ejection fraction (HCC)  I50.32 dapagliflozin propanediol (FARXIGA) 10 MG TABS tablet    Pro b natriuretic peptide (BNP)    Basic metabolic panel    Magnesium    2. History of non-ST elevation myocardial infarction (NSTEMI)  I25.2     3. Atherosclerosis of native coronary artery of native heart without angina pectoris  I25.10     4. Hx of TIA (transient ischemic  attack) and stroke  Z86.73     5. Benign hypertension  I10     6. Mixed hyperlipidemia  E78.2 rosuvastatin (CRESTOR) 20 MG tablet    7. OSA on CPAP  G47.33    Z99.89     8. Chronic obstructive pulmonary disease, unspecified COPD type ([place])  J44.9     9. Oxygen dependent  Z99.81     10. Class 3 severe obesity due to excess calories with serious comorbidity and body mass index (BMI) of 45.0 to 49.9 in adult [facility])  E66.01    Z68.42        RECOMMENDATIONS: Sharica Roedel is a 66 y.o. female whose past medical history and cardiac risk factors include: History of non-STEMI, history of stroke, heart failure with preserved EF, three-vessel coronary artery disease, hyperlipidemia, hypertension, OSA on CPAP, pulmonary hypertension, COPD with oxygen dependence 2 L, postmenopausal female, advanced age, obesity.  Chronic heart failure with preserved ejection fraction (HCC) Chronic. We will continue to uptitrate guideline directed medical therapy. Discontinue amlodipine Start Farxiga 10 mg p.o. daily.  With labs in 1 week to evaluate kidney function and electrolytes.  Medication voucher also provided to the patient. If renal function remains stable after initiating Farxiga the neck step would be to uptitrate Entresto to 97/103 mg p.o. twice daily (a new prescription will need to be sent).  Principal care management data reviewed with the patient and her sister at today's  office visit.  Blood pressure is within acceptable range. Medications reconciled. We discussed implementing lifestyle changes.  Patient is very motivated to lose weight.  Recommended considering weight watchers or other weight loss programs provided by her insurance company.  Atherosclerosis of native coronary artery of native heart without angina pectoris/history of non-STEMI/multivessel CAD Chronic and stable Denies angina pectoris. No use of sublingual nitroglycerin tablets. NSTEMI January 2021-noted to have multivessel CAD/HFpEF.  However given the acute stroke the decision was to treat medically. Echo 12/2020: Preserved LVEF, grade 2 diastolic dysfunction, elevated LAP, and no significant valvular heart disease. Monitor for now  Hx of stroke Chronic and stable  Residual deficits include slurred speech Educated on importance of secondary prevention. Medications reconciled. Continue aspirin, statin therapy, blood pressure management, glycemic control, lipid management.  Benign hypertension Chronic and improving Discontinue amlodipine and will start [place].  If renal function remains stable will uptitrate Entresto Principal care management data reviewed as part of today's encounter. Medication changes as discussed.  Mixed hyperlipidemia /statin intolerance Was on atorvastatin 80 mg p.o. nightly; however, patient endorses bilateral lower extremity myalgias. Will transition to Crestor 20 mg p.o. nightly.  Will need follow-up lipids in 6 weeks to reevaluate therapy and monitor LFTs. Monitor for now  OSA on CPAP Patient is compliant with the use of CPAP daily.  Monitor for now.  Chronic obstructive pulmonary disease, unspecified COPD type ([place]) / Oxygen dependent Patient has been encouraged to follow-up with pulmonary medicine given her COPD requiring oxygen dependence and underlying sleep apnea.  Class 3 severe obesity due to excess calories with serious comorbidity and body mass  index (BMI) of 45.0 to 49.9 in adult [facility]) Body mass index is 48.47 kg/m. I reviewed with the patient the importance of diet, regular physical activity/exercise, weight loss.   Patient is educated on increasing physical activity gradually as tolerated.  With the goal of moderate intensity exercise for 30 minutes a day 5 days a week.  FINAL MEDICATION LIST END OF ENCOUNTER: Meds ordered this encounter  Medications  dapagliflozin propanediol (FARXIGA) 10 MG TABS tablet    Sig: Take 1 tablet (10 mg total) by mouth daily before breakfast.    Dispense:  90 tablet    Refill:  0   rosuvastatin (CRESTOR) 20 MG tablet    Sig: Take 1 tablet (20 mg total) by mouth at bedtime.    Dispense:  90 tablet    Refill:  0     Medications Discontinued During This Encounter  Medication Reason   amLODipine (NORVASC) 10 MG tablet    atorvastatin (LIPITOR) 80 MG tablet       Current Outpatient Medications:    albuterol (VENTOLIN HFA) 108 (90 Base) MCG/ACT inhaler, Inhale 2 puffs into the lungs every 6 (six) hours as needed for wheezing or shortness of breath., Disp: 8 g, Rfl: 6   aspirin 81 MG chewable tablet, 1 tablet, Disp: , Rfl:    carvedilol (COREG) 6.25 MG tablet, TAKE 1 TABLET(6.25 MG) BY MOUTH TWICE DAILY WITH A MEAL, Disp: 180 tablet, Rfl: 2   Cholecalciferol 25 MCG (1000 UT) tablet, Take by mouth., Disp: , Rfl:    Cyanocobalamin 500 MCG LOZG, Take 500 mcg by mouth daily., Disp: , Rfl:    dapagliflozin propanediol (FARXIGA) 10 MG TABS tablet, Take 1 tablet (10 mg total) by mouth daily before breakfast., Disp: 90 tablet, Rfl: 0   Ferrous Sulfate (IRON) 325 (65 Fe) MG TABS, 1 tablet, Disp: , Rfl:    Fluticasone-Umeclidin-Vilant (TRELEGY ELLIPTA) 100-62.5-25 MCG/INH AEPB, 1 puff, Disp: , Rfl:    gabapentin (NEURONTIN) 100 MG capsule, 2 at 7 pm, Disp: , Rfl:    levETIRAcetam (KEPPRA) 750 MG tablet, Take 1 tablet (750 mg total) by mouth 2 (two) times daily., Disp: 180 tablet, Rfl: 4   omeprazole  (PRILOSEC) 20 MG capsule, 1 capsule, Disp: , Rfl:    Pyridoxine HCl (VITAMIN B6) 100 MG TABS, 1 tablet, Disp: , Rfl:    rosuvastatin (CRESTOR) 20 MG tablet, Take 1 tablet (20 mg total) by mouth at bedtime., Disp: 90 tablet, Rfl: 0   sacubitril-valsartan (ENTRESTO) 24-26 MG, Take 2 tablets by mouth 2 (two) times daily., Disp: 180 tablet, Rfl: 1   nitroGLYCERIN (NITROSTAT) 0.4 MG SL tablet, Place 1 tablet (0.4 mg total) under the tongue every 5 (five) minutes as needed for chest pain. If you require more than two tablets five minutes apart go to the nearest ER via EMS., Disp: 30 tablet, Rfl: 0  Orders Placed This Encounter  Procedures   Pro b natriuretic peptide (BNP)   Basic metabolic panel   Magnesium   There are no Patient Instructions on file for this visit.   --Continue cardiac medications as reconciled in final medication list. --Return in about 3 months (around 01/05/2022) for Follow up, CAD, heart failure management.. Or sooner if needed. --Continue follow-up with your primary care physician regarding the management of your other chronic comorbid conditions.  Patient's questions and concerns were addressed to her satisfaction. She voices understanding of the instructions provided during this encounter.   This note was created using a voice recognition software as a result there may be grammatical errors inadvertently enclosed that do not reflect the nature of this encounter. Every attempt is made to correct such errors.   Rex Kras, [place], [facility]  Pager: [phone] Office: [phone]

## 2021-10-08 NOTE — Telephone Encounter (Signed)
Okay to change order  

## 2021-10-08 NOTE — Telephone Encounter (Signed)
Called and spoke with Greggory Brandy and the Librarian, academic at [facility] [facility] to let them know that they DO NOT need to call if patient's heart rate goes below 60 since her base line is in the 50's. Also let them know that order her been placed to get patient a bariatric rollator walker. And asked them to refax the paperwork for Dr. Wynona Neat to sign because he states that he has not seen it for this patient. Provided Crystal with [facility] fax number. Will wait to get fax

## 2021-10-08 NOTE — Telephone Encounter (Signed)
Called and spoke with pt's sister [name] letting her know the info per AO and she verbalized understanding. Order has been placed for the rollator and bedside commode. Nothing further needed.

## 2021-10-08 NOTE — Telephone Encounter (Signed)
Okay to send an order but this should normally to be managed by patient's primary doctor

## 2021-10-09 ENCOUNTER — Telehealth: Payer: Self-pay | Admitting: Pulmonary Disease

## 2021-10-09 ENCOUNTER — Other Ambulatory Visit: Payer: Self-pay

## 2021-10-09 MED ORDER — BARIATRIC ROLLATOR MISC: 1.0000 "application " | Freq: Every day | 0 refills | Status: AC

## 2021-10-09 MED ORDER — COMMODE BEDSIDE MISC: 1.0000 "application " | Freq: Every day | 0 refills | Status: AC

## 2021-10-09 NOTE — Telephone Encounter (Signed)
Called and spoke with [facility] and got the fax number and the DX code for the orders. Will have Dr Val Eagle sign orders. Nothing further needed.

## 2021-10-09 NOTE — Telephone Encounter (Signed)
Called and spoke with daughter and she states she is going to call her point lady at [facility] [employer] to have them call us so we can get the DX code taken care of.

## 2021-10-11 DIAGNOSIS — I5032 Chronic diastolic (congestive) heart failure: Secondary | ICD-10-CM | POA: Diagnosis not present

## 2021-10-12 ENCOUNTER — Other Ambulatory Visit: Payer: Self-pay

## 2021-10-12 ENCOUNTER — Ambulatory Visit (INDEPENDENT_AMBULATORY_CARE_PROVIDER_SITE_OTHER): Payer: Medicare Other | Admitting: Podiatry

## 2021-10-12 ENCOUNTER — Ambulatory Visit: Payer: Medicare Other | Admitting: Podiatry

## 2021-10-12 DIAGNOSIS — M79674 Pain in right toe(s): Secondary | ICD-10-CM | POA: Diagnosis not present

## 2021-10-12 DIAGNOSIS — M1000 Idiopathic gout, unspecified site: Secondary | ICD-10-CM

## 2021-10-12 DIAGNOSIS — B351 Tinea unguium: Secondary | ICD-10-CM

## 2021-10-12 DIAGNOSIS — M79675 Pain in left toe(s): Secondary | ICD-10-CM | POA: Diagnosis not present

## 2021-10-13 ENCOUNTER — Ambulatory Visit (INDEPENDENT_AMBULATORY_CARE_PROVIDER_SITE_OTHER): Payer: Medicare Other | Admitting: Neurology

## 2021-10-13 ENCOUNTER — Encounter: Payer: Self-pay | Admitting: Neurology

## 2021-10-13 ENCOUNTER — Telehealth: Payer: Self-pay | Admitting: Pulmonary Disease

## 2021-10-13 VITALS — BP 146/73 | HR 62 | Ht 62.0 in | Wt 256.0 lb

## 2021-10-13 DIAGNOSIS — I639 Cerebral infarction, unspecified: Secondary | ICD-10-CM | POA: Diagnosis not present

## 2021-10-13 DIAGNOSIS — R569 Unspecified convulsions: Secondary | ICD-10-CM | POA: Diagnosis not present

## 2021-10-13 DIAGNOSIS — R2681 Unsteadiness on feet: Secondary | ICD-10-CM

## 2021-10-13 DIAGNOSIS — I272 Pulmonary hypertension, unspecified: Secondary | ICD-10-CM

## 2021-10-13 DIAGNOSIS — R0602 Shortness of breath: Secondary | ICD-10-CM

## 2021-10-13 DIAGNOSIS — R4789 Other speech disturbances: Secondary | ICD-10-CM | POA: Diagnosis not present

## 2021-10-13 MED ORDER — LEVETIRACETAM 750 MG PO TABS
750.0000 mg | ORAL_TABLET | Freq: Two times a day (BID) | ORAL | 4 refills | Status: DC
Start: 2021-10-13 — End: 2021-10-13

## 2021-10-13 MED ORDER — DIVALPROEX SODIUM ER 500 MG PO TB24: 1000.0000 mg | ORAL_TABLET | Freq: Every day | ORAL | 11 refills | Status: DC

## 2021-10-13 NOTE — Patient Instructions (Addendum)
Stop keppra 750mg   one tab twice a day  Start Depakote ER 500mg  2 tabs every night.

## 2021-10-13 NOTE — Progress Notes (Signed)
Chief Complaint  Patient presents with   Follow-up    Emg room 4, with sister  States she is stable, no new sx or concerns       ASSESSMENT AND PLAN  Laurie Wall is a 66 y.o. female History of stroke in January 2021  Was treated at [facility], MRI reported borderline stroke involving left bilateral frontal, also temporal (left MCA/PCA border zone) per report,  Ultrasound of carotid artery showed less than 40% stenosis of bilateral internal carotid artery, vertebral artery was not visible,  Multiple vascular risk factors, include hypertension, hyperlipidemia, longtime smoker, obstructive sleep apnea, COPD, obesity  On aspirin 81 mg only, history of GI bleeding required blood transfusion in May 2021 Seizure  Only had 1 seizure when she had acute stroke, no recurrent seizure things, Mood disorder  Reported Keppra 750 twice a day because agitation, will stop Keppra, start Depakote ER 500 mg 2 tablets every night  Return to clinic in 6 months  DIAGNOSTIC DATA (LABS, IMAGING, TESTING) - I reviewed patient records, labs, notes, testing and imaging myself where available.  Laboratory evaluations July 2021: Hg 11.1,  CMP creat 0.77, LDL 72, Hep C , HIV, negative,   March 2022, Keppra level 38.4, June 2021 38.3  MRI brain on Jan 26th 2021 at [facility] Multifocal regions of restricted diffusion with corresponding T2/FLAIR hyperintensity involving the cortex and subcortical white matter in the cerebral hemisphere, predominantly in the left frontal and parietal lobes with additional involvement of the left temporal lobe. There is ill-defined enhancement involving the affected left frontal and parietal regions (17:17, 20).   Dural based, T1/T2 isointense, homogeneously enhancing mass in the left frontal lobe measures 0.9 x 0.9 cm (17:18). No midline shift. No hydrocephalus. No extra-axial fluid collections are identified. No evidence of acute intracranial hemorrhage.    ECHO on  August 2021    1. The left ventricle is normal in size with normal wall thickness.    2. The left ventricular systolic function is normal, LVEF is visually  estimated at 55-60%.    3. There is grade II diastolic dysfunction (elevated filling pressure).    4. The left atrium is mildly dilated in size.    5. The right ventricle is normal in size, with normal systolic function.    6. There is moderate pulmonary hypertension, estimated pulmonary artery  systolic pressure is 56 mmHg.    7. The right atrium is mildly dilated  in size.    US Carotid on Oct 03 2019: Right Carotid: There is evidence in the ICA of a less than 40% stenosis.                 Non-hemodynamically significant plaque noted in the CCA. Limited                 Study.  Left Carotid: There is evidence in the ICA of a less than 40% stenosis.                Non-hemodynamically significant plaque noted in the CCA. Limited                Study.  Vertebrals:  Bilateral vertebral arteries were not visualized.  Subclavians: Normal flow hemodynamics were seen in bilateral subclavian               arteries.   MEDICAL HISTORY:  Laurie Wall is a 66 year old female, seen in request by her primary care physician Dr. Wenda Low, for  evaluation of stroke, seizure, initial evaluation was on April 14, 2021,  I reviewed and summarized the referring note. PMHX. HTN HLD. COPD, smoker in the past, quit in Jan 2021, before that 1ppd, oxygen dependent July 2021. OSA, CPAP, but not compliance with it.  [place] [place], she suffered a stroke in January 2021, with residual right arm and leg weakness, reliant on a walker since then, also suffered aphasia, expressive more than comprehensive  Reviewed MRI of the brain report [facility] October 02, [facility], subacute appearing multiple focal left cerebral infarction, possible MCA/PCAborder zone distribution, involving left frontal, parietal  lobe, with additional involvement of left temporal lobe,  Ultrasound of carotid artery showed less than 40% stenosis of bilateral internal carotid artery, bilateral vertebral artery was not visualized  Sleep study on October 05, 2019, severe obstructive sleep apnea, associated with oxygen desaturation 77, I also reviewed description of sleep architecture, AHI 60/per hour  She also suffered GI bleeding, required blood transfusion in May 2021  She had a seizure following her stroke, has been taking Keppra now 750 mg twice a day, there was no recurrent seizure since,  Prior  to her stroke, she worked at a doctor's office  UPDATE Oct 13 2021: She is accompanied by her sister at today's clinical visit, overall she is doing very well, mild unsteady gait, aphasia has much improved, with deliberate effort, she can understand three-step commands, no recurrent seizure, but while taking Keppra 750 mg twice a day, noticed some mood change, get agitated easily  PHYSICAL EXAM:   Vitals:   10/13/21 1102  BP: (!) 146/73  Pulse: 62  Weight: 256 lb (116.1 kg)  Height: 5\' 2"  (1.575 m)   Not recorded     Body mass index is 46.82 kg/m.  PHYSICAL EXAMNIATION:  Gen: NAD, conversant, well nourised, well groomed                     Cardiovascular: Regular rate rhythm, no peripheral edema, warm, nontender. Eyes: Conjunctivae clear without exudates or hemorrhage Neck: Supple, no carotid bruits. Pulmonary: Clear to auscultation bilaterally   NEUROLOGICAL EXAM:  MENTAL STATUS: Speech/cognition: Deliberate effort, mild expressive and comprehensive aphasia,    CRANIAL NERVES: CN II: Visual fields are full to confrontation. Pupils are round equal and briskly reactive to light. CN III, IV, VI: extraocular movement are normal. No ptosis. CN V: Facial sensation is intact to light touch CN VII: Right lower face weakness CN VIII: Hearing is normal to causal conversation. CN IX, X: Phonation is  normal. CN XI: Head turning and shoulder shrug are intact  MOTOR: Only slight right upper and lower extremity weakness,  REFLEXES: Slight hyperreflexia right side  SENSORY: Intact to light touch, pinprick and vibratory sensation are intact in fingers and toes.  COORDINATION: There is no trunk or limb dysmetria noted.  GAIT/STANCE: Push on chair to get up from seated position, unsteady wide-based  REVIEW OF SYSTEMS:  Full 14 system review of systems performed and notable only for as above All other review of systems were negative.   ALLERGIES: Allergies  Allergen Reactions   Lamotrigine Hives   Ace Inhibitors Cough   Other Rash    Percodan    Oxycodone-Acetaminophen Rash    Other reaction(s): rash Other reaction(s): rash   Oxycodone-Aspirin Rash    Other reaction(s): rash    HOME MEDICATIONS: Current Outpatient Medications  Medication Sig Dispense Refill  albuterol (VENTOLIN HFA) 108 (90 Base) MCG/ACT inhaler Inhale 2 puffs into the lungs every 6 (six) hours as needed for wheezing or shortness of breath. 8 g 6   aspirin 81 MG chewable tablet 1 tablet     carvedilol (COREG) 6.25 MG tablet TAKE 1 TABLET(6.25 MG) BY MOUTH TWICE DAILY WITH A MEAL 180 tablet 2   Cholecalciferol 25 MCG (1000 UT) tablet Take by mouth.     Cyanocobalamin 500 MCG LOZG Take 500 mcg by mouth daily.     dapagliflozin propanediol (FARXIGA) 10 MG TABS tablet Take 1 tablet (10 mg total) by mouth daily before breakfast. 90 tablet 0   Ferrous Sulfate (IRON) 325 (65 Fe) MG TABS 1 tablet     Fluticasone-Umeclidin-Vilant (TRELEGY ELLIPTA) 100-62.5-25 MCG/INH AEPB 1 puff     gabapentin (NEURONTIN) 100 MG capsule 2 at 7 pm     levETIRAcetam (KEPPRA) 750 MG tablet Take 1 tablet (750 mg total) by mouth 2 (two) times daily. 180 tablet 4   Misc. Devices (BARIATRIC ROLLATOR) MISC 1 application by Does not apply route daily. 1 each 0   Misc. Devices (COMMODE BEDSIDE) MISC 1 application by Does not apply route  daily. 1 each 0   omeprazole (PRILOSEC) 20 MG capsule 1 capsule     Pyridoxine HCl (VITAMIN B6) 100 MG TABS 1 tablet     rosuvastatin (CRESTOR) 20 MG tablet Take 1 tablet (20 mg total) by mouth at bedtime. 90 tablet 0   sacubitril-valsartan (ENTRESTO) 24-26 MG Take 2 tablets by mouth 2 (two) times daily. 180 tablet 1   nitroGLYCERIN (NITROSTAT) 0.4 MG SL tablet Place 1 tablet (0.4 mg total) under the tongue every 5 (five) minutes as needed for chest pain. If you require more than two tablets five minutes apart go to the nearest ER via EMS. 30 tablet 0   No current facility-administered medications for this visit.    PAST MEDICAL HISTORY: Past Medical History:  Diagnosis Date   CHF (congestive heart failure) (HCC)    COPD (chronic obstructive pulmonary disease) (HCC)    Coronary artery disease    Hyperlipidemia    Hypertension    NSTEMI (non-ST elevated myocardial infarction) (HCC)    OSA on CPAP    Oxygen dependent    Pulmonary hypertension (HCC)    Seizure ([place])    Stroke ([place])     PAST SURGICAL HISTORY: Past Surgical History:  Procedure Laterality Date   CARDIAC CATHETERIZATION     MASTECTOMY Bilateral    TUBAL LIGATION      FAMILY HISTORY: Family History  Problem Relation Age of Onset   Heart attack Mother    Heart attack Father    Asthma Sister    Hyperlipidemia Sister    Hypertension Brother    Hyperlipidemia Sister     SOCIAL HISTORY: Social History   Socioeconomic History   Marital status: Single    Spouse name: Not on file   Number of children: 1   Years of education: Not on file   Highest education level: Not on file  Occupational History   Not on file  Tobacco Use   Smoking status: Former    Packs/day: 1.00    Years: 30.00    Pack years: 30.00    Types: Cigarettes    Start date: 64    Quit date: 09/21/2019    Years since quitting: 2.0   Smokeless tobacco: Never  Vaping Use   Vaping Use: Never used  Substance and Sexual  Activity   Alcohol  use: Never   Drug use: Never   Sexual activity: Not on file  Other Topics Concern   Not on file  Social History Narrative   Lives alone   Right Handed   Drinks 1-2 cups caffeine daily    Social Determinants of Health   Financial Resource Strain: Not on file  Food Insecurity: Not on file  Transportation Needs: Not on file  Physical Activity: Not on file  Stress: Not on file  Social Connections: Not on file  Intimate Partner Violence: Not on file      Marcial Pacas, M.D. Ph.D.  [facility] [address], [place], [place] [address] Ph: [phone] Fax: [phone]  CC:  Wenda Low, [address] [place],  [place] [address]  Wenda Low, MD

## 2021-10-13 NOTE — Telephone Encounter (Signed)
[name] sister is returning phone call. [name] phone number is [phone].

## 2021-10-13 NOTE — Telephone Encounter (Signed)
Laurie Wall states that patient does not qualify for the bariatric rollator, so the order needs to be for a regular rollator and they want to know why the patient needs it. Patient is on  oxygen 24/7 and needs it as soon as possible.   Please advise.

## 2021-10-13 NOTE — Telephone Encounter (Signed)
Called patient's sister but she did not answer. Left message for her to call back.  

## 2021-10-14 ENCOUNTER — Other Ambulatory Visit: Payer: Self-pay

## 2021-10-14 DIAGNOSIS — I272 Pulmonary hypertension, unspecified: Secondary | ICD-10-CM | POA: Diagnosis not present

## 2021-10-14 DIAGNOSIS — Z9981 Dependence on supplemental oxygen: Secondary | ICD-10-CM | POA: Diagnosis not present

## 2021-10-14 DIAGNOSIS — I5030 Unspecified diastolic (congestive) heart failure: Secondary | ICD-10-CM | POA: Diagnosis not present

## 2021-10-14 DIAGNOSIS — Z7982 Long term (current) use of aspirin: Secondary | ICD-10-CM | POA: Diagnosis not present

## 2021-10-14 DIAGNOSIS — Z8673 Personal history of transient ischemic attack (TIA), and cerebral infarction without residual deficits: Secondary | ICD-10-CM | POA: Diagnosis not present

## 2021-10-14 DIAGNOSIS — I2510 Atherosclerotic heart disease of native coronary artery without angina pectoris: Secondary | ICD-10-CM | POA: Diagnosis not present

## 2021-10-14 DIAGNOSIS — I252 Old myocardial infarction: Secondary | ICD-10-CM | POA: Diagnosis not present

## 2021-10-14 DIAGNOSIS — J441 Chronic obstructive pulmonary disease with (acute) exacerbation: Secondary | ICD-10-CM | POA: Diagnosis not present

## 2021-10-14 DIAGNOSIS — R5381 Other malaise: Secondary | ICD-10-CM | POA: Diagnosis not present

## 2021-10-14 DIAGNOSIS — I110 Hypertensive heart disease with heart failure: Secondary | ICD-10-CM | POA: Diagnosis not present

## 2021-10-14 DIAGNOSIS — Z87891 Personal history of nicotine dependence: Secondary | ICD-10-CM | POA: Diagnosis not present

## 2021-10-14 DIAGNOSIS — G4733 Obstructive sleep apnea (adult) (pediatric): Secondary | ICD-10-CM | POA: Diagnosis not present

## 2021-10-14 DIAGNOSIS — Z9989 Dependence on other enabling machines and devices: Secondary | ICD-10-CM | POA: Diagnosis not present

## 2021-10-14 DIAGNOSIS — I5032 Chronic diastolic (congestive) heart failure: Secondary | ICD-10-CM

## 2021-10-14 DIAGNOSIS — Z7951 Long term (current) use of inhaled steroids: Secondary | ICD-10-CM | POA: Diagnosis not present

## 2021-10-14 DIAGNOSIS — E785 Hyperlipidemia, unspecified: Secondary | ICD-10-CM | POA: Diagnosis not present

## 2021-10-14 NOTE — Telephone Encounter (Signed)
Spoke with [name]  She states that [employer] said they need new order for rollator  The one we ordered was large but she does not qualify for that based on her wt  I put in new referral for standard size  Nothing further needed

## 2021-10-15 NOTE — Progress Notes (Signed)
Subjective: 67 y.o. returns the office today for painful, elongated, thickened toenails which she  cannot trim herself. Denies any redness or drainage around the nails.  No ulcerations that she reports.  She has no other concerns today. Denies any systemic complaints such as fevers, chills.    PCP: Georgann Housekeeper, MD  Objective: AAO 3, NAD DP/PT pulses palpable, CRT less than 3 seconds Nails hypertrophic, dystrophic, elongated, brittle, discolored 10. There is tenderness overlying the nails 1-5 bilaterally. There is no surrounding erythema or drainage along the nail sites. Dry skin is present with any open sores. No open lesions or pre-ulcerative lesions are identified. No, dry skin.  with calf compression, swelling, warmth, erythema.  Assessment: Patient presents with symptomatic onychomycosis  Plan: -Treatment options including alternatives, risks, complications were discussed -Nails sharply debrided 10 without complication/bleeding.  Again discussed treatment options for nail fungus. -Moisturizer daily but do not apply interdigitally. -Discussed daily foot inspection. If there are any changes, to call the office immediately.  -Follow-up in 3 months or sooner if any problems are to arise. In the meantime, encouraged to call the office with any questions, concerns, changes symptoms.  Ovid Curd, DPM

## 2021-10-19 DIAGNOSIS — I252 Old myocardial infarction: Secondary | ICD-10-CM | POA: Diagnosis not present

## 2021-10-19 DIAGNOSIS — Z7951 Long term (current) use of inhaled steroids: Secondary | ICD-10-CM | POA: Diagnosis not present

## 2021-10-19 DIAGNOSIS — Z87891 Personal history of nicotine dependence: Secondary | ICD-10-CM | POA: Diagnosis not present

## 2021-10-19 DIAGNOSIS — Z9989 Dependence on other enabling machines and devices: Secondary | ICD-10-CM | POA: Diagnosis not present

## 2021-10-19 DIAGNOSIS — I110 Hypertensive heart disease with heart failure: Secondary | ICD-10-CM | POA: Diagnosis not present

## 2021-10-19 DIAGNOSIS — E785 Hyperlipidemia, unspecified: Secondary | ICD-10-CM | POA: Diagnosis not present

## 2021-10-19 DIAGNOSIS — Z8673 Personal history of transient ischemic attack (TIA), and cerebral infarction without residual deficits: Secondary | ICD-10-CM | POA: Diagnosis not present

## 2021-10-19 DIAGNOSIS — R5381 Other malaise: Secondary | ICD-10-CM | POA: Diagnosis not present

## 2021-10-19 DIAGNOSIS — I272 Pulmonary hypertension, unspecified: Secondary | ICD-10-CM | POA: Diagnosis not present

## 2021-10-19 DIAGNOSIS — G4733 Obstructive sleep apnea (adult) (pediatric): Secondary | ICD-10-CM | POA: Diagnosis not present

## 2021-10-19 DIAGNOSIS — I5030 Unspecified diastolic (congestive) heart failure: Secondary | ICD-10-CM | POA: Diagnosis not present

## 2021-10-19 DIAGNOSIS — Z9981 Dependence on supplemental oxygen: Secondary | ICD-10-CM | POA: Diagnosis not present

## 2021-10-19 DIAGNOSIS — J441 Chronic obstructive pulmonary disease with (acute) exacerbation: Secondary | ICD-10-CM | POA: Diagnosis not present

## 2021-10-19 DIAGNOSIS — Z7982 Long term (current) use of aspirin: Secondary | ICD-10-CM | POA: Diagnosis not present

## 2021-10-19 DIAGNOSIS — I2510 Atherosclerotic heart disease of native coronary artery without angina pectoris: Secondary | ICD-10-CM | POA: Diagnosis not present

## 2021-10-20 DIAGNOSIS — I5032 Chronic diastolic (congestive) heart failure: Secondary | ICD-10-CM | POA: Diagnosis not present

## 2021-10-21 LAB — BASIC METABOLIC PANEL
BUN/Creatinine Ratio: 16 (ref 12–28)
BUN: 16 mg/dL (ref 8–27)
CO2: 25 mmol/L (ref 20–29)
Calcium: 9.5 mg/dL (ref 8.7–10.3)
Chloride: 104 mmol/L (ref 96–106)
Creatinine, Ser: 0.97 mg/dL (ref 0.57–1.00)
Glucose: 90 mg/dL (ref 70–99)
Potassium: 4 mmol/L (ref 3.5–5.2)
Sodium: 144 mmol/L (ref 134–144)
eGFR: 64 mL/min/{1.73_m2} (ref >59)

## 2021-10-21 LAB — MAGNESIUM: Magnesium: 2.1 mg/dL (ref 1.6–2.3)

## 2021-10-21 LAB — PRO B NATRIURETIC PEPTIDE: NT-Pro BNP: 199 pg/mL (ref 0–301)

## 2021-10-22 DIAGNOSIS — Z9989 Dependence on other enabling machines and devices: Secondary | ICD-10-CM | POA: Diagnosis not present

## 2021-10-22 DIAGNOSIS — I2510 Atherosclerotic heart disease of native coronary artery without angina pectoris: Secondary | ICD-10-CM | POA: Diagnosis not present

## 2021-10-22 DIAGNOSIS — J441 Chronic obstructive pulmonary disease with (acute) exacerbation: Secondary | ICD-10-CM | POA: Diagnosis not present

## 2021-10-22 DIAGNOSIS — G4733 Obstructive sleep apnea (adult) (pediatric): Secondary | ICD-10-CM | POA: Diagnosis not present

## 2021-10-22 DIAGNOSIS — Z7982 Long term (current) use of aspirin: Secondary | ICD-10-CM | POA: Diagnosis not present

## 2021-10-22 DIAGNOSIS — I5030 Unspecified diastolic (congestive) heart failure: Secondary | ICD-10-CM | POA: Diagnosis not present

## 2021-10-22 DIAGNOSIS — Z87891 Personal history of nicotine dependence: Secondary | ICD-10-CM | POA: Diagnosis not present

## 2021-10-22 DIAGNOSIS — I252 Old myocardial infarction: Secondary | ICD-10-CM | POA: Diagnosis not present

## 2021-10-22 DIAGNOSIS — R5381 Other malaise: Secondary | ICD-10-CM | POA: Diagnosis not present

## 2021-10-22 DIAGNOSIS — Z7951 Long term (current) use of inhaled steroids: Secondary | ICD-10-CM | POA: Diagnosis not present

## 2021-10-22 DIAGNOSIS — Z9981 Dependence on supplemental oxygen: Secondary | ICD-10-CM | POA: Diagnosis not present

## 2021-10-22 DIAGNOSIS — E785 Hyperlipidemia, unspecified: Secondary | ICD-10-CM | POA: Diagnosis not present

## 2021-10-22 DIAGNOSIS — I110 Hypertensive heart disease with heart failure: Secondary | ICD-10-CM | POA: Diagnosis not present

## 2021-10-22 DIAGNOSIS — Z8673 Personal history of transient ischemic attack (TIA), and cerebral infarction without residual deficits: Secondary | ICD-10-CM | POA: Diagnosis not present

## 2021-10-22 DIAGNOSIS — I272 Pulmonary hypertension, unspecified: Secondary | ICD-10-CM | POA: Diagnosis not present

## 2021-10-25 DIAGNOSIS — I272 Pulmonary hypertension, unspecified: Secondary | ICD-10-CM | POA: Diagnosis not present

## 2021-10-26 ENCOUNTER — Other Ambulatory Visit: Payer: Self-pay | Admitting: Pharmacist

## 2021-10-26 DIAGNOSIS — I5032 Chronic diastolic (congestive) heart failure: Secondary | ICD-10-CM

## 2021-10-26 MED ORDER — ENTRESTO 49-51 MG PO TABS: 1.0000 | ORAL_TABLET | Freq: Two times a day (BID) | ORAL | 2 refills | Status: DC

## 2021-10-26 NOTE — Progress Notes (Signed)
Called and reviewed lab results with pt and pt's sister. Pt has been tolerating recent entresto dose increase from 24/26 mg BID to 49/51 mg BID. Pt reports home BP and weight readings continues to remain stable. Denies any complains of lightheadedness, dizziness, SOB, lower extremity swelling. Pt planing on bringing her vital signs to the office Wednesday. Will review vitals, and if stable, will further titrate Entresto dose to 97/103 mg BID. Rx for 49/51 mg refilled and sent to pt's pharmacy

## 2021-10-30 ENCOUNTER — Telehealth: Payer: Self-pay | Admitting: Student

## 2021-10-30 DIAGNOSIS — I10 Essential (primary) hypertension: Secondary | ICD-10-CM

## 2021-10-30 NOTE — Telephone Encounter (Signed)
ON-CALL CARDIOLOGY 10/30/21  Patient's name: Laurie Wall.   MRN: [id number].    DOB: 10/28/1955 Primary care provider: Georgann Housekeeper, MD. Primary cardiologist: Dr. Ihor Austin regarding this patient's care today: Patient had spoken to [facility] earlier and was advised to check blood pressure. Most recent home blood pressure check was 149/78 mmHg and heart rate 53 bpm.   Impression:   ICD-10-CM   1. Benign hypertension  I10       No orders of the defined types were placed in this encounter.   No orders of the defined types were placed in this encounter.   Recommendations: No changes made at this time. Will continue remote patient monitoring.   Telephone encounter total time: 3 minutes     Rayford Halsted, PA-C 10/30/2021, 4:22 PM Office: [phone]

## 2021-11-02 DIAGNOSIS — G4733 Obstructive sleep apnea (adult) (pediatric): Secondary | ICD-10-CM | POA: Diagnosis not present

## 2021-11-02 NOTE — Telephone Encounter (Signed)
Thank you :)

## 2021-11-09 ENCOUNTER — Encounter: Payer: Self-pay | Admitting: Pharmacist

## 2021-11-09 NOTE — Progress Notes (Signed)
This encounter was created in error - please disregard.

## 2021-11-10 DIAGNOSIS — I5032 Chronic diastolic (congestive) heart failure: Secondary | ICD-10-CM | POA: Diagnosis not present

## 2021-11-11 DIAGNOSIS — Z7982 Long term (current) use of aspirin: Secondary | ICD-10-CM | POA: Diagnosis not present

## 2021-11-11 DIAGNOSIS — Z9981 Dependence on supplemental oxygen: Secondary | ICD-10-CM | POA: Diagnosis not present

## 2021-11-11 DIAGNOSIS — I2510 Atherosclerotic heart disease of native coronary artery without angina pectoris: Secondary | ICD-10-CM | POA: Diagnosis not present

## 2021-11-11 DIAGNOSIS — I5030 Unspecified diastolic (congestive) heart failure: Secondary | ICD-10-CM | POA: Diagnosis not present

## 2021-11-11 DIAGNOSIS — Z8673 Personal history of transient ischemic attack (TIA), and cerebral infarction without residual deficits: Secondary | ICD-10-CM | POA: Diagnosis not present

## 2021-11-11 DIAGNOSIS — I272 Pulmonary hypertension, unspecified: Secondary | ICD-10-CM | POA: Diagnosis not present

## 2021-11-11 DIAGNOSIS — Z7951 Long term (current) use of inhaled steroids: Secondary | ICD-10-CM | POA: Diagnosis not present

## 2021-11-11 DIAGNOSIS — G4733 Obstructive sleep apnea (adult) (pediatric): Secondary | ICD-10-CM | POA: Diagnosis not present

## 2021-11-11 DIAGNOSIS — I110 Hypertensive heart disease with heart failure: Secondary | ICD-10-CM | POA: Diagnosis not present

## 2021-11-11 DIAGNOSIS — Z87891 Personal history of nicotine dependence: Secondary | ICD-10-CM | POA: Diagnosis not present

## 2021-11-11 DIAGNOSIS — I252 Old myocardial infarction: Secondary | ICD-10-CM | POA: Diagnosis not present

## 2021-11-11 DIAGNOSIS — Z9989 Dependence on other enabling machines and devices: Secondary | ICD-10-CM | POA: Diagnosis not present

## 2021-11-11 DIAGNOSIS — R5381 Other malaise: Secondary | ICD-10-CM | POA: Diagnosis not present

## 2021-11-11 DIAGNOSIS — E785 Hyperlipidemia, unspecified: Secondary | ICD-10-CM | POA: Diagnosis not present

## 2021-11-11 DIAGNOSIS — J441 Chronic obstructive pulmonary disease with (acute) exacerbation: Secondary | ICD-10-CM | POA: Diagnosis not present

## 2021-11-12 DIAGNOSIS — I5032 Chronic diastolic (congestive) heart failure: Secondary | ICD-10-CM | POA: Diagnosis not present

## 2021-11-13 ENCOUNTER — Telehealth: Payer: Self-pay

## 2021-11-13 ENCOUNTER — Telehealth: Payer: Self-pay | Admitting: *Deleted

## 2021-11-13 ENCOUNTER — Telehealth: Payer: Self-pay | Admitting: Pharmacist

## 2021-11-13 DIAGNOSIS — I272 Pulmonary hypertension, unspecified: Secondary | ICD-10-CM | POA: Diagnosis not present

## 2021-11-13 DIAGNOSIS — I5032 Chronic diastolic (congestive) heart failure: Secondary | ICD-10-CM

## 2021-11-13 DIAGNOSIS — Z9981 Dependence on supplemental oxygen: Secondary | ICD-10-CM | POA: Diagnosis not present

## 2021-11-13 DIAGNOSIS — R5381 Other malaise: Secondary | ICD-10-CM | POA: Diagnosis not present

## 2021-11-13 DIAGNOSIS — I110 Hypertensive heart disease with heart failure: Secondary | ICD-10-CM | POA: Diagnosis not present

## 2021-11-13 DIAGNOSIS — I252 Old myocardial infarction: Secondary | ICD-10-CM | POA: Diagnosis not present

## 2021-11-13 DIAGNOSIS — Z9989 Dependence on other enabling machines and devices: Secondary | ICD-10-CM | POA: Diagnosis not present

## 2021-11-13 DIAGNOSIS — Z7951 Long term (current) use of inhaled steroids: Secondary | ICD-10-CM | POA: Diagnosis not present

## 2021-11-13 DIAGNOSIS — Z87891 Personal history of nicotine dependence: Secondary | ICD-10-CM | POA: Diagnosis not present

## 2021-11-13 DIAGNOSIS — J441 Chronic obstructive pulmonary disease with (acute) exacerbation: Secondary | ICD-10-CM | POA: Diagnosis not present

## 2021-11-13 DIAGNOSIS — Z7982 Long term (current) use of aspirin: Secondary | ICD-10-CM | POA: Diagnosis not present

## 2021-11-13 DIAGNOSIS — I5030 Unspecified diastolic (congestive) heart failure: Secondary | ICD-10-CM | POA: Diagnosis not present

## 2021-11-13 DIAGNOSIS — Z8673 Personal history of transient ischemic attack (TIA), and cerebral infarction without residual deficits: Secondary | ICD-10-CM | POA: Diagnosis not present

## 2021-11-13 DIAGNOSIS — E785 Hyperlipidemia, unspecified: Secondary | ICD-10-CM | POA: Diagnosis not present

## 2021-11-13 DIAGNOSIS — G4733 Obstructive sleep apnea (adult) (pediatric): Secondary | ICD-10-CM | POA: Diagnosis not present

## 2021-11-13 DIAGNOSIS — I2510 Atherosclerotic heart disease of native coronary artery without angina pectoris: Secondary | ICD-10-CM | POA: Diagnosis not present

## 2021-11-13 LAB — BASIC METABOLIC PANEL
BUN/Creatinine Ratio: 13 (ref 12–28)
BUN: 12 mg/dL (ref 8–27)
CO2: 30 mmol/L — ABNORMAL HIGH (ref 20–29)
Calcium: 9.3 mg/dL (ref 8.7–10.3)
Chloride: 104 mmol/L (ref 96–106)
Creatinine, Ser: 0.89 mg/dL (ref 0.57–1.00)
Glucose: 102 mg/dL — ABNORMAL HIGH (ref 70–99)
Potassium: 3.8 mmol/L (ref 3.5–5.2)
Sodium: 145 mmol/L — ABNORMAL HIGH (ref 134–144)
eGFR: 71 mL/min/{1.73_m2} (ref >59)

## 2021-11-13 LAB — MAGNESIUM: Magnesium: 2 mg/dL (ref 1.6–2.3)

## 2021-11-13 LAB — PRO B NATRIURETIC PEPTIDE: NT-Pro BNP: 307 pg/mL — ABNORMAL HIGH (ref 0–301)

## 2021-11-13 NOTE — Telephone Encounter (Signed)
Called and spoke with patient's sister, [name] [facility]), she states she has an SD card in the machine. She will bring the SD card with her and if she cannot get the card out of the machine, she will bring the entire machine.  Nothing further needed. ?

## 2021-11-13 NOTE — Telephone Encounter (Signed)
Please call sister [name] today if possible for readings//ah ?

## 2021-11-15 MED ORDER — ENTRESTO 97-103 MG PO TABS: 1.0000 | ORAL_TABLET | Freq: Two times a day (BID) | ORAL | 3 refills | Status: DC

## 2021-11-15 MED ORDER — SPIRONOLACTONE 25 MG PO TABS: 25.0000 mg | ORAL_TABLET | Freq: Every day | ORAL | 3 refills | Status: DC

## 2021-11-16 ENCOUNTER — Encounter: Payer: Self-pay | Admitting: Pulmonary Disease

## 2021-11-16 ENCOUNTER — Other Ambulatory Visit: Payer: Self-pay

## 2021-11-16 ENCOUNTER — Ambulatory Visit (INDEPENDENT_AMBULATORY_CARE_PROVIDER_SITE_OTHER): Payer: Medicare Other | Admitting: Pulmonary Disease

## 2021-11-16 VITALS — BP 124/74 | HR 90 | Temp 98.8°F | Ht 62.0 in | Wt 264.0 lb

## 2021-11-16 DIAGNOSIS — G4733 Obstructive sleep apnea (adult) (pediatric): Secondary | ICD-10-CM

## 2021-11-16 NOTE — Patient Instructions (Signed)
Try and use your CPAP on a nightly basis ? ?Encourage weight loss efforts ? ?I will see you in about 6 weeks ? ?Call with significant concerns ?

## 2021-11-16 NOTE — Progress Notes (Signed)
? ?      ?Laurie Wall    LQ:[id number]    01/20/1956 ? ?Primary Care Physician:Husain, Denton Ar, MD ? ?Referring Physician: Wenda Low, MD ?[address] ?[address] ?[place],  [place] [address] ? ?Chief complaint:   ?In for follow-up for shortness of breath, obstructive sleep apnea ?Has not been using CPAP ?She is accompanied by her sister who takes care of her ?Has been feeling relatively well ? ? ?HPI: ?History of obstructive lung disease ?-On bronchodilators ? ?History of obstructive sleep apnea ?-Has not been compliant with CPAP ? ?History of pulmonary hypertension ? ?History of coronary artery disease ?Multivessel coronary artery disease ? ?History of CVA January 2021 ?History of seizures January 2021 ? ?She has been doing relatively well ? ?Tries to remain active, needs a walker to ambulate ? ?Her sister is very involved in her care and helps her with activities and medications ? ?She does have significant dysarthria ? ?Uses oxygen regularly ?Has not been compliant with CPAP ? ?Relocated from [place], [place] ? ?Quit smoking about 21 months ago, 30-pack-year smoking history ? ?No pertinent occupational history, no pets, no recent travel ? ?Outpatient Encounter Medications as of 11/16/2021  ?Medication Sig  ? albuterol (VENTOLIN HFA) 108 (90 Base) MCG/ACT inhaler Inhale 2 puffs into the lungs every 6 (six) hours as needed for wheezing or shortness of breath.  ? aspirin 81 MG chewable tablet 1 tablet  ? carvedilol (COREG) 6.25 MG tablet TAKE 1 TABLET(6.25 MG) BY MOUTH TWICE DAILY WITH A MEAL  ? Cholecalciferol 25 MCG (1000 UT) tablet Take by mouth.  ? Cyanocobalamin 500 MCG LOZG Take 500 mcg by mouth daily.  ? dapagliflozin propanediol (FARXIGA) 10 MG TABS tablet Take 1 tablet (10 mg total) by mouth daily before breakfast.  ? divalproex (DEPAKOTE ER) 500 MG 24 hr tablet Take 2 tablets (1,000 mg total) by mouth at bedtime.  ? Ferrous Sulfate (IRON) 325 (65 Fe) MG TABS 1 tablet  ?  Fluticasone-Umeclidin-Vilant (TRELEGY ELLIPTA) 100-62.5-25 MCG/INH AEPB 1 puff  ? gabapentin (NEURONTIN) 100 MG capsule 2 at 7 pm  ? Misc. Devices (BARIATRIC ROLLATOR) MISC 1 application by Does not apply route daily.  ? Misc. Devices (COMMODE BEDSIDE) MISC 1 application by Does not apply route daily.  ? omeprazole (PRILOSEC) 20 MG capsule 1 capsule  ? Pyridoxine HCl (VITAMIN B6) 100 MG TABS 1 tablet  ? rosuvastatin (CRESTOR) 20 MG tablet Take 1 tablet (20 mg total) by mouth at bedtime.  ? sacubitril-valsartan (ENTRESTO) 97-103 MG Take 1 tablet by mouth 2 (two) times daily.  ? spironolactone (ALDACTONE) 25 MG tablet Take 1 tablet (25 mg total) by mouth daily.  ? nitroGLYCERIN (NITROSTAT) 0.4 MG SL tablet Place 1 tablet (0.4 mg total) under the tongue every 5 (five) minutes as needed for chest pain. If you require more than two tablets five minutes apart go to the nearest ER via EMS.  ? [DISCONTINUED] sacubitril-valsartan (ENTRESTO) 49-51 MG Take 1 tablet by mouth 2 (two) times daily. (Patient taking differently: Take 2 tablets by mouth 2 (two) times daily.)  ? ?No facility-administered encounter medications on file as of 11/16/2021.  ? ? ?Allergies as of 11/16/2021 - Review Complete 11/16/2021  ?Allergen Reaction Noted  ? Lamotrigine Hives 01/30/2020  ? Ace inhibitors Cough 06/21/2019  ? Other Rash 06/07/2021  ? Oxycodone-acetaminophen Rash 06/21/2019  ? Oxycodone-aspirin Rash 06/21/2019  ? ? ?Past Medical History:  ?Diagnosis Date  ? CHF (congestive heart failure) ([place])   ?  COPD (chronic obstructive pulmonary disease) ([place])   ? Coronary artery disease   ? Hyperlipidemia   ? Hypertension   ? NSTEMI (non-ST elevated myocardial infarction) ([place])   ? OSA on CPAP   ? Oxygen dependent   ? Pulmonary hypertension ([place])   ? Seizure ([place])   ? Stroke [facility])   ? ? ?Past Surgical History:  ?Procedure Laterality Date  ? CARDIAC CATHETERIZATION    ? MASTECTOMY Bilateral   ? TUBAL LIGATION    ? ? ?Family History  ?Problem Relation  Age of Onset  ? Heart attack Mother   ? Heart attack Father   ? Asthma Sister   ? Hyperlipidemia Sister   ? Hypertension Brother   ? Hyperlipidemia Sister   ? ? ?Social History  ? ?Socioeconomic History  ? Marital status: Single  ?  Spouse name: Not on file  ? Number of children: 1  ? Years of education: Not on file  ? Highest education level: Not on file  ?Occupational History  ? Not on file  ?Tobacco Use  ? Smoking status: Former  ?  Packs/day: 1.00  ?  Years: 30.00  ?  Pack years: 30.00  ?  Types: Cigarettes  ?  Start date: 1970  ?  Quit date: 09/21/2019  ?  Years since quitting: 2.1  ? Smokeless tobacco: Never  ?Vaping Use  ? Vaping Use: Never used  ?Substance and Sexual Activity  ? Alcohol use: Never  ? Drug use: Never  ? Sexual activity: Not on file  ?Other Topics Concern  ? Not on file  ?Social History Narrative  ? Lives alone  ? Right Handed  ? Drinks 1-2 cups caffeine daily   ? ?Social Determinants of Health  ? ?Financial Resource Strain: Not on file  ?Food Insecurity: Not on file  ?Transportation Needs: Not on file  ?Physical Activity: Not on file  ?Stress: Not on file  ?Social Connections: Not on file  ?Intimate Partner Violence: Not on file  ? ? ?Review of Systems  ?Constitutional:  Negative for fever.  ?Respiratory:  Positive for shortness of breath.   ?Cardiovascular:  Negative for chest pain.  ?Psychiatric/Behavioral:  Positive for sleep disturbance.   ? ?Vitals:  ? 11/16/21 1413  ?BP: 124/74  ?Pulse: 90  ?Temp: 98.8 ?F (37.1 ?C)  ?SpO2: 98%  ? ? ? ? ?Physical Exam ?Constitutional:   ?   Appearance: She is obese.  ?HENT:  ?   Head: Normocephalic and atraumatic.  ?   Nose: Nose normal.  ?   Mouth/Throat:  ?   Mouth: Mucous membranes are moist.  ?Eyes:  ?   General:     ?   Right eye: No discharge.     ?   Left eye: No discharge.  ?Cardiovascular:  ?   Rate and Rhythm: Normal rate and regular rhythm.  ?   Pulses: Normal pulses.  ?   Heart sounds: Normal heart sounds. No murmur heard. ?Pulmonary:  ?    Effort: Pulmonary effort is normal. No respiratory distress.  ?   Breath sounds: No stridor. No wheezing or rhonchi.  ?Musculoskeletal:  ?   Cervical back: No rigidity or tenderness.  ?Neurological:  ?   Mental Status: She is alert.  ?Psychiatric:     ?   Mood and Affect: Mood normal.  ? ? ? ?Data Reviewed: ?Care everywhere visits reviewed ? ?Notes by Dr. Shearon Stalls pulmonary from December 2021 reviewed ? ?Echocardiogram shows diastolic dysfunction shows,  ejection fraction of 64% ? ?Assessment:  ?Pulmonary hypertension ?-Continue current care ? ?Obstructive sleep apnea ?-Encouraged to resume CPAP use and use it on a nightly basis ? ?Chronic obstructive pulmonary disease ?-On Trelegy and albuterol ? ?Chronic respiratory failure on oxygen supplementation ?-Continue oxygen ? ?Deconditioning ?-Encouraged to continue exercises as tolerated ? ?History of diastolic heart failure ?Coronary artery disease ? ? ?Plan/Recommendations: ? ?.  Resume CPAP nightly ? ?.  Continue inhalers ? ?.  Continue oxygen supplementation ? ?.  Follow-up in about 6 weeks ? ? ?Sherrilyn Rist MD ?[facility] ?11/16/2021, 2:48 PM ? ?CC: Wenda Low, MD ? ? ? ?

## 2021-11-22 DIAGNOSIS — I272 Pulmonary hypertension, unspecified: Secondary | ICD-10-CM | POA: Diagnosis not present

## 2021-11-26 DIAGNOSIS — Z8673 Personal history of transient ischemic attack (TIA), and cerebral infarction without residual deficits: Secondary | ICD-10-CM | POA: Diagnosis not present

## 2021-11-26 DIAGNOSIS — I110 Hypertensive heart disease with heart failure: Secondary | ICD-10-CM | POA: Diagnosis not present

## 2021-11-26 DIAGNOSIS — I2510 Atherosclerotic heart disease of native coronary artery without angina pectoris: Secondary | ICD-10-CM | POA: Diagnosis not present

## 2021-11-26 DIAGNOSIS — E785 Hyperlipidemia, unspecified: Secondary | ICD-10-CM | POA: Diagnosis not present

## 2021-11-26 DIAGNOSIS — Z9981 Dependence on supplemental oxygen: Secondary | ICD-10-CM | POA: Diagnosis not present

## 2021-11-26 DIAGNOSIS — Z7951 Long term (current) use of inhaled steroids: Secondary | ICD-10-CM | POA: Diagnosis not present

## 2021-11-26 DIAGNOSIS — R5381 Other malaise: Secondary | ICD-10-CM | POA: Diagnosis not present

## 2021-11-26 DIAGNOSIS — J449 Chronic obstructive pulmonary disease, unspecified: Secondary | ICD-10-CM | POA: Diagnosis not present

## 2021-11-26 DIAGNOSIS — Z9989 Dependence on other enabling machines and devices: Secondary | ICD-10-CM | POA: Diagnosis not present

## 2021-11-26 DIAGNOSIS — I5030 Unspecified diastolic (congestive) heart failure: Secondary | ICD-10-CM | POA: Diagnosis not present

## 2021-11-26 DIAGNOSIS — I252 Old myocardial infarction: Secondary | ICD-10-CM | POA: Diagnosis not present

## 2021-11-26 DIAGNOSIS — G4733 Obstructive sleep apnea (adult) (pediatric): Secondary | ICD-10-CM | POA: Diagnosis not present

## 2021-11-26 DIAGNOSIS — Z87891 Personal history of nicotine dependence: Secondary | ICD-10-CM | POA: Diagnosis not present

## 2021-11-26 DIAGNOSIS — I272 Pulmonary hypertension, unspecified: Secondary | ICD-10-CM | POA: Diagnosis not present

## 2021-12-03 ENCOUNTER — Encounter: Payer: Self-pay | Admitting: Student

## 2021-12-03 DIAGNOSIS — I252 Old myocardial infarction: Secondary | ICD-10-CM | POA: Diagnosis not present

## 2021-12-03 DIAGNOSIS — Z9981 Dependence on supplemental oxygen: Secondary | ICD-10-CM | POA: Diagnosis not present

## 2021-12-03 DIAGNOSIS — I272 Pulmonary hypertension, unspecified: Secondary | ICD-10-CM | POA: Diagnosis not present

## 2021-12-03 DIAGNOSIS — I2510 Atherosclerotic heart disease of native coronary artery without angina pectoris: Secondary | ICD-10-CM | POA: Diagnosis not present

## 2021-12-03 DIAGNOSIS — Z87891 Personal history of nicotine dependence: Secondary | ICD-10-CM | POA: Diagnosis not present

## 2021-12-03 DIAGNOSIS — Z9989 Dependence on other enabling machines and devices: Secondary | ICD-10-CM | POA: Diagnosis not present

## 2021-12-03 DIAGNOSIS — Z8673 Personal history of transient ischemic attack (TIA), and cerebral infarction without residual deficits: Secondary | ICD-10-CM | POA: Diagnosis not present

## 2021-12-03 DIAGNOSIS — R5381 Other malaise: Secondary | ICD-10-CM | POA: Diagnosis not present

## 2021-12-03 DIAGNOSIS — I5030 Unspecified diastolic (congestive) heart failure: Secondary | ICD-10-CM | POA: Diagnosis not present

## 2021-12-03 DIAGNOSIS — G4733 Obstructive sleep apnea (adult) (pediatric): Secondary | ICD-10-CM | POA: Diagnosis not present

## 2021-12-03 DIAGNOSIS — J449 Chronic obstructive pulmonary disease, unspecified: Secondary | ICD-10-CM | POA: Diagnosis not present

## 2021-12-03 DIAGNOSIS — Z7951 Long term (current) use of inhaled steroids: Secondary | ICD-10-CM | POA: Diagnosis not present

## 2021-12-03 DIAGNOSIS — I110 Hypertensive heart disease with heart failure: Secondary | ICD-10-CM | POA: Diagnosis not present

## 2021-12-03 DIAGNOSIS — E785 Hyperlipidemia, unspecified: Secondary | ICD-10-CM | POA: Diagnosis not present

## 2021-12-04 ENCOUNTER — Telehealth: Payer: Self-pay | Admitting: Pulmonary Disease

## 2021-12-04 DIAGNOSIS — I5032 Chronic diastolic (congestive) heart failure: Secondary | ICD-10-CM | POA: Diagnosis not present

## 2021-12-04 NOTE — Telephone Encounter (Signed)
Pt's sister called back to review home BP readings. Home BP readings improving since starting spironolactone 25 mg. Avg BP of 152/77. Pt still hasnt gotten repeat labs since. Pt reports that she has noticed improvement in her lower extremity swelling since. Will wait for pt's labs to be completed before considering increasing spironolactone dose to 50 mg.  ?

## 2021-12-04 NOTE — Telephone Encounter (Signed)
Called patient. She stated that she figured who had called and what they called for. She had no concerns at the time of call. ? ?Nothing further needed at time of call.  ?

## 2021-12-05 LAB — BASIC METABOLIC PANEL
BUN/Creatinine Ratio: 16 (ref 12–28)
BUN: 15 mg/dL (ref 8–27)
CO2: 30 mmol/L — ABNORMAL HIGH (ref 20–29)
Calcium: 9.2 mg/dL (ref 8.7–10.3)
Chloride: 101 mmol/L (ref 96–106)
Creatinine, Ser: 0.92 mg/dL (ref 0.57–1.00)
Glucose: 86 mg/dL (ref 70–99)
Potassium: 4.2 mmol/L (ref 3.5–5.2)
Sodium: 142 mmol/L (ref 134–144)
eGFR: 69 mL/min/{1.73_m2} (ref >59)

## 2021-12-05 LAB — MAGNESIUM: Magnesium: 2.1 mg/dL (ref 1.6–2.3)

## 2021-12-05 LAB — PRO B NATRIURETIC PEPTIDE: NT-Pro BNP: 203 pg/mL (ref 0–301)

## 2021-12-07 NOTE — Progress Notes (Signed)
Lab results reviewed with pt and pt's sister. Labs stable following recent start spironolactone 25 mg daily. Home BP continues to remain elevated. Will increase spironolactone to 50 mg for better HTN management. Pt to come by the office on Wednesday to review home BP readings and review medication management concerns.

## 2021-12-09 ENCOUNTER — Telehealth: Payer: Self-pay | Admitting: Pharmacist

## 2021-12-09 ENCOUNTER — Other Ambulatory Visit: Payer: Self-pay | Admitting: Pharmacist

## 2021-12-09 DIAGNOSIS — I5032 Chronic diastolic (congestive) heart failure: Secondary | ICD-10-CM

## 2021-12-09 MED ORDER — DAPAGLIFLOZIN PROPANEDIOL 10 MG PO TABS: 10.0000 mg | ORAL_TABLET | Freq: Every day | ORAL | 3 refills | Status: DC

## 2021-12-09 MED ORDER — ENTRESTO 97-103 MG PO TABS: 1.0000 | ORAL_TABLET | Freq: Two times a day (BID) | ORAL | 3 refills | Status: AC

## 2021-12-09 MED ORDER — SPIRONOLACTONE 50 MG PO TABS: 50.0000 mg | ORAL_TABLET | Freq: Every day | ORAL | 3 refills | Status: AC

## 2021-12-11 DIAGNOSIS — I5032 Chronic diastolic (congestive) heart failure: Secondary | ICD-10-CM | POA: Diagnosis not present

## 2021-12-16 ENCOUNTER — Other Ambulatory Visit: Payer: Self-pay | Admitting: Cardiology

## 2021-12-16 DIAGNOSIS — I2510 Atherosclerotic heart disease of native coronary artery without angina pectoris: Secondary | ICD-10-CM | POA: Diagnosis not present

## 2021-12-16 DIAGNOSIS — I252 Old myocardial infarction: Secondary | ICD-10-CM | POA: Diagnosis not present

## 2021-12-16 DIAGNOSIS — I5030 Unspecified diastolic (congestive) heart failure: Secondary | ICD-10-CM | POA: Diagnosis not present

## 2021-12-16 DIAGNOSIS — J449 Chronic obstructive pulmonary disease, unspecified: Secondary | ICD-10-CM | POA: Diagnosis not present

## 2021-12-16 DIAGNOSIS — Z7951 Long term (current) use of inhaled steroids: Secondary | ICD-10-CM | POA: Diagnosis not present

## 2021-12-16 DIAGNOSIS — Z8673 Personal history of transient ischemic attack (TIA), and cerebral infarction without residual deficits: Secondary | ICD-10-CM | POA: Diagnosis not present

## 2021-12-16 DIAGNOSIS — Z87891 Personal history of nicotine dependence: Secondary | ICD-10-CM | POA: Diagnosis not present

## 2021-12-16 DIAGNOSIS — I5032 Chronic diastolic (congestive) heart failure: Secondary | ICD-10-CM

## 2021-12-16 DIAGNOSIS — I272 Pulmonary hypertension, unspecified: Secondary | ICD-10-CM | POA: Diagnosis not present

## 2021-12-16 DIAGNOSIS — G4733 Obstructive sleep apnea (adult) (pediatric): Secondary | ICD-10-CM | POA: Diagnosis not present

## 2021-12-16 DIAGNOSIS — R5381 Other malaise: Secondary | ICD-10-CM | POA: Diagnosis not present

## 2021-12-16 DIAGNOSIS — Z9981 Dependence on supplemental oxygen: Secondary | ICD-10-CM | POA: Diagnosis not present

## 2021-12-16 DIAGNOSIS — I110 Hypertensive heart disease with heart failure: Secondary | ICD-10-CM | POA: Diagnosis not present

## 2021-12-16 DIAGNOSIS — Z9989 Dependence on other enabling machines and devices: Secondary | ICD-10-CM | POA: Diagnosis not present

## 2021-12-16 DIAGNOSIS — E785 Hyperlipidemia, unspecified: Secondary | ICD-10-CM | POA: Diagnosis not present

## 2021-12-16 NOTE — Telephone Encounter (Signed)
Med list reviewed and updated together. Reviewed home BP readings. Home BP readings continues to remain elevated and not at goal. Pt was recommended to increase her spironolactone dose from 25 mg to 50 mg. Labs order for repeat labs in 1 week. Pt verbalized understanding to medication dose increase. Will follow up once labs completed ?

## 2021-12-17 DIAGNOSIS — I5032 Chronic diastolic (congestive) heart failure: Secondary | ICD-10-CM | POA: Diagnosis not present

## 2021-12-18 LAB — BASIC METABOLIC PANEL
BUN/Creatinine Ratio: 15 (ref 12–28)
BUN: 14 mg/dL (ref 8–27)
CO2: 29 mmol/L (ref 20–29)
Calcium: 9.3 mg/dL (ref 8.7–10.3)
Chloride: 101 mmol/L (ref 96–106)
Creatinine, Ser: 0.96 mg/dL (ref 0.57–1.00)
Glucose: 74 mg/dL (ref 70–99)
Potassium: 4.4 mmol/L (ref 3.5–5.2)
Sodium: 142 mmol/L (ref 134–144)
eGFR: 65 mL/min/{1.73_m2} (ref >59)

## 2021-12-18 LAB — MAGNESIUM: Magnesium: 2.1 mg/dL (ref 1.6–2.3)

## 2021-12-23 DIAGNOSIS — I272 Pulmonary hypertension, unspecified: Secondary | ICD-10-CM | POA: Diagnosis not present

## 2021-12-31 DIAGNOSIS — Z8673 Personal history of transient ischemic attack (TIA), and cerebral infarction without residual deficits: Secondary | ICD-10-CM | POA: Diagnosis not present

## 2021-12-31 DIAGNOSIS — R569 Unspecified convulsions: Secondary | ICD-10-CM | POA: Diagnosis not present

## 2021-12-31 DIAGNOSIS — E785 Hyperlipidemia, unspecified: Secondary | ICD-10-CM | POA: Diagnosis not present

## 2021-12-31 DIAGNOSIS — I872 Venous insufficiency (chronic) (peripheral): Secondary | ICD-10-CM | POA: Diagnosis not present

## 2021-12-31 DIAGNOSIS — R471 Dysarthria and anarthria: Secondary | ICD-10-CM | POA: Diagnosis not present

## 2021-12-31 DIAGNOSIS — I10 Essential (primary) hypertension: Secondary | ICD-10-CM | POA: Diagnosis not present

## 2021-12-31 DIAGNOSIS — G4733 Obstructive sleep apnea (adult) (pediatric): Secondary | ICD-10-CM | POA: Diagnosis not present

## 2021-12-31 DIAGNOSIS — I5030 Unspecified diastolic (congestive) heart failure: Secondary | ICD-10-CM | POA: Diagnosis not present

## 2021-12-31 DIAGNOSIS — I2510 Atherosclerotic heart disease of native coronary artery without angina pectoris: Secondary | ICD-10-CM | POA: Diagnosis not present

## 2021-12-31 DIAGNOSIS — J9611 Chronic respiratory failure with hypoxia: Secondary | ICD-10-CM | POA: Diagnosis not present

## 2021-12-31 DIAGNOSIS — I270 Primary pulmonary hypertension: Secondary | ICD-10-CM | POA: Diagnosis not present

## 2022-01-04 ENCOUNTER — Other Ambulatory Visit: Payer: Self-pay | Admitting: Cardiology

## 2022-01-04 ENCOUNTER — Ambulatory Visit: Payer: Medicare Other | Admitting: Pulmonary Disease

## 2022-01-04 ENCOUNTER — Telehealth: Payer: Self-pay | Admitting: *Deleted

## 2022-01-04 DIAGNOSIS — E782 Mixed hyperlipidemia: Secondary | ICD-10-CM

## 2022-01-04 MED ORDER — GABAPENTIN 100 MG PO CAPS
200.0000 mg | ORAL_CAPSULE | Freq: Two times a day (BID) | ORAL | 1 refills | Status: DC
Start: 2022-01-04 — End: 2022-04-20

## 2022-01-04 NOTE — Telephone Encounter (Signed)
I spoke to the patient's sister/caregiver, [name] (on DPR). She is asking for Dr. Terrace Arabia to take over the management of gabapentin. She has been taking gabapentin 100mg , 2 caps BID since her stroke. It has helped her neuropathic pain. Per vo by Dr. , okay to provide refills. Rx sent to MD to cosign. ?

## 2022-01-05 ENCOUNTER — Ambulatory Visit: Payer: Medicare Other | Admitting: Cardiology

## 2022-01-05 ENCOUNTER — Encounter: Payer: Self-pay | Admitting: Cardiology

## 2022-01-05 VITALS — BP 176/82 | HR 61 | Temp 96.8°F | Resp 16 | Ht 62.0 in | Wt 257.0 lb

## 2022-01-05 DIAGNOSIS — I10 Essential (primary) hypertension: Secondary | ICD-10-CM

## 2022-01-05 DIAGNOSIS — E782 Mixed hyperlipidemia: Secondary | ICD-10-CM

## 2022-01-05 DIAGNOSIS — I2510 Atherosclerotic heart disease of native coronary artery without angina pectoris: Secondary | ICD-10-CM | POA: Diagnosis not present

## 2022-01-05 DIAGNOSIS — I5032 Chronic diastolic (congestive) heart failure: Secondary | ICD-10-CM

## 2022-01-05 DIAGNOSIS — Z789 Other specified health status: Secondary | ICD-10-CM | POA: Diagnosis not present

## 2022-01-05 DIAGNOSIS — I252 Old myocardial infarction: Secondary | ICD-10-CM

## 2022-01-05 DIAGNOSIS — J449 Chronic obstructive pulmonary disease, unspecified: Secondary | ICD-10-CM | POA: Diagnosis not present

## 2022-01-05 DIAGNOSIS — Z9981 Dependence on supplemental oxygen: Secondary | ICD-10-CM

## 2022-01-05 DIAGNOSIS — G4733 Obstructive sleep apnea (adult) (pediatric): Secondary | ICD-10-CM | POA: Diagnosis not present

## 2022-01-05 DIAGNOSIS — Z9989 Dependence on other enabling machines and devices: Secondary | ICD-10-CM | POA: Diagnosis not present

## 2022-01-05 DIAGNOSIS — Z8673 Personal history of transient ischemic attack (TIA), and cerebral infarction without residual deficits: Secondary | ICD-10-CM

## 2022-01-05 MED ORDER — ISOSORB DINITRATE-HYDRALAZINE 20-37.5 MG PO TABS: 1.0000 | ORAL_TABLET | Freq: Three times a day (TID) | ORAL | 0 refills | Status: DC

## 2022-01-05 MED ORDER — ROSUVASTATIN CALCIUM 20 MG PO TABS: 20.0000 mg | ORAL_TABLET | Freq: Every day | ORAL | 0 refills | Status: DC

## 2022-01-05 NOTE — Progress Notes (Signed)
?ID:  Laurie Wall, DOB March 29, 1956, MRN [id number] ? ?PCP: Georgann Housekeeper, MD  ?Cardiologist:  Tessa Lerner, DO, [facility] (established care 11/18/2020) ?Former Cardiology Providers: Jacquelyne Balint MD, Dr. Charmaine Downs   ? ?Date: 01/05/22 ?Last Office Visit: 10/08/2021 ? ?Chief Complaint  ?Patient presents with  ? heart failure management  ? Coronary Artery Disease  ? Follow-up  ? ? ?HPI  ?Laurie Wall is a 66 y.o. female whose past medical history and cardiovascular risk factors include: History of non-STEMI, history of stroke, heart failure with preserved EF, three-vessel coronary artery disease, hyperlipidemia, hypertension, OSA on CPAP, pulmonary hypertension, COPD with oxygen dependence 2 L, postmenopausal female, advanced age, obesity. ? ?Patient was referred to the practice after moving to [facility] from [facility] for management of CAD, recent stroke, and history of non-STEMI.  She is accompanied by her sister [name] who can be reached at [phone].  Patient provides verbal consent in regards to discussing her medical information in her presence. ? ?In January 2021 when she presented to [facility] as a non-STEMI underwent angiography and was noted to have multivessel CAD.  Since she also had a stroke during the same hospitalization this shared decision was to treat her CAD medically with close follow-up.  She establish care with our practice in March 2022 and since then she has been uptitrated on GDMT and undergone ischemic evaluation. ? ?Patient presents today for 67-month follow-up visit for management of CAD and HFpEF.  She is doing well from a cardiovascular standpoint she denies any heart failure symptoms or chest pain.  However, her home blood pressure log notes elevated blood pressures.  On visual estimation her systolic blood pressures are ranging between 150s-175 mmHg, DBP 70-85 mmHg, pulse 48-60s.  Patient has been compliant with her current medical therapy.  And since last visit she is  now maxed out on Entresto 97/103 mg p.o. twice daily and also Farxiga 10 mg p.o. daily.  Independently reviewed labs from December 17, 2021 which notes stable kidney function and electrolytes.  Patient is no longer in ambulatory blood pressure monitoring as she has difficulty putting on the blood pressure cuff.  She uses a wrist cuff and checks her numbers daily. ? ?Family history of premature coronary disease but no sudden cardiac death.  Brother had a myocardial infarction at the age of 56. ? ?FUNCTIONAL STATUS: ?No structured exercise program or daily routine.  ? ?ALLERGIES: ?Allergies  ?Allergen Reactions  ? Lamotrigine Hives  ? Ace Inhibitors Cough  ? Other Rash  ?  Percodan ?  ? Oxycodone-Acetaminophen Rash  ?  Other reaction(s): rash ?Other reaction(s): rash  ? Oxycodone-Aspirin Rash  ?  Other reaction(s): rash  ? ? ?MEDICATION LIST PRIOR TO VISIT: ?Current Meds  ?Medication Sig  ? albuterol (VENTOLIN HFA) 108 (90 Base) MCG/ACT inhaler Inhale 2 puffs into the lungs every 6 (six) hours as needed for wheezing or shortness of breath.  ? aspirin 81 MG chewable tablet 1 tablet  ? carvedilol (COREG) 6.25 MG tablet TAKE 1 TABLET(6.25 MG) BY MOUTH TWICE DAILY WITH A MEAL  ? Cholecalciferol (VITAMIN D3) 50 MCG (2000 UT) capsule Take 2,000 Units by mouth daily.  ? dapagliflozin propanediol (FARXIGA) 10 MG TABS tablet Take 1 tablet (10 mg total) by mouth daily before breakfast.  ? divalproex (DEPAKOTE ER) 500 MG 24 hr tablet Take 2 tablets (1,000 mg total) by mouth at bedtime.  ? Ferrous Sulfate (IRON) 325 (65 Fe) MG TABS 1 tablet  ? Fluticasone-Umeclidin-Vilant (TRELEGY  ELLIPTA) 100-62.5-25 MCG/INH AEPB 1 puff  ? gabapentin (NEURONTIN) 100 MG capsule Take 2 capsules (200 mg total) by mouth 2 (two) times daily.  ? isosorbide-hydrALAZINE (BIDIL) 20-37.5 MG tablet Take 1 tablet by mouth 3 (three) times daily.  ? Misc. Devices (BARIATRIC ROLLATOR) MISC 1 application by Does not apply route daily.  ? Misc. Devices (COMMODE  BEDSIDE) MISC 1 application by Does not apply route daily.  ? omeprazole (PRILOSEC) 20 MG capsule 1 capsule  ? pyridOXINE (VITAMIN B-6) 100 MG tablet Take 100 mg by mouth daily.  ? sacubitril-valsartan (ENTRESTO) 97-103 MG Take 1 tablet by mouth 2 (two) times daily.  ? spironolactone (ALDACTONE) 50 MG tablet Take 1 tablet (50 mg total) by mouth daily.  ? vitamin B-12 (CYANOCOBALAMIN) 500 MCG tablet Take 500 mcg by mouth daily.  ? [DISCONTINUED] rosuvastatin (CRESTOR) 20 MG tablet Take 1 tablet (20 mg total) by mouth at bedtime.  ?  ? ?PAST MEDICAL HISTORY: ?Past Medical History:  ?Diagnosis Date  ? CHF (congestive heart failure) (HCC)   ? COPD (chronic obstructive pulmonary disease) (HCC)   ? Coronary artery disease   ? Hyperlipidemia   ? Hypertension   ? NSTEMI (non-ST elevated myocardial infarction) (HCC)   ? OSA on CPAP   ? Oxygen dependent   ? Pulmonary hypertension (HCC)   ? Seizure (HCC)   ? Stroke [facility])   ? ? ?PAST SURGICAL HISTORY: ?Past Surgical History:  ?Procedure Laterality Date  ? CARDIAC CATHETERIZATION    ? MASTECTOMY Bilateral   ? TUBAL LIGATION    ? ? ?FAMILY HISTORY: ?The patient family history includes Asthma in her sister; Heart attack in her father and mother; Hyperlipidemia in her sister and sister; Hypertension in her brother. ? ?SOCIAL HISTORY:  ?The patient  reports that she quit smoking about 2 years ago. Her smoking use included cigarettes. She started smoking about 53 years ago. She has a 30.00 pack-year smoking history. She has never used smokeless tobacco. She reports that she does not drink alcohol and does not use drugs. ? ?REVIEW OF SYSTEMS: ?Review of Systems  ?Constitutional: Negative for chills and fever.  ?HENT:  Negative for hoarse voice and nosebleeds.   ?Eyes:  Negative for discharge, double vision and pain.  ?Cardiovascular:  Negative for chest pain, claudication, dyspnea on exertion, leg swelling, near-syncope, orthopnea, palpitations, paroxysmal nocturnal dyspnea and  syncope.  ?Respiratory:  Negative for hemoptysis and shortness of breath.   ?Musculoskeletal:  Positive for arthritis and muscle cramps. Negative for myalgias.  ?Gastrointestinal:  Negative for abdominal pain, constipation, diarrhea, hematemesis, hematochezia, melena, nausea and vomiting.  ?Neurological:  Positive for seizures. Negative for dizziness and light-headedness.  ?     Speaking difficulties (expressive aphasia)  ? ?PHYSICAL EXAM: ? ?  01/05/2022  ?  2:26 PM 01/05/2022  ?  1:49 PM 11/16/2021  ?  2:13 PM  ?Vitals with BMI  ?Height  5\' 2"  5\' 2"   ?Weight  257 lbs 264 lbs  ?BMI  46.99 48.27  ?Systolic 176 205  ?Diastolic 82 75 74  ?Pulse 61 52 90  ? ? ?CONSTITUTIONAL: Appears older than stated age, hemodynamically stable, no acute distress.    ?SKIN: Skin is warm and dry. No rash noted. No cyanosis. No pallor. No jaundice ?HEAD: Normocephalic and atraumatic.  ?EYES: No scleral icterus ?MOUTH/THROAT: Moist oral membranes.  ?NECK: No JVD present. No thyromegaly noted. No carotid bruits  ?LYMPHATIC: No visible cervical adenopathy.  ?CHEST Normal respiratory effort. No intercostal  retractions  ?LUNGS: Clear to auscultation bilaterally.  No stridor. No wheezes. No rales.  ?CARDIOVASCULAR: Regular, positive S1-S2, no murmurs rubs or gallops appreciated. ?ABDOMINAL: Obese, soft, nontender, nondistended, positive bowel sounds in all 4 quadrants, no apparent ascites.  ?EXTREMITIES: Trace bilateral peripheral edema, darker skin pigmentation to suggest chronic venous insufficiency, decreased bilateral DP and PT pulses. ?HEMATOLOGIC: No significant bruising ?NEUROLOGIC: Oriented to person, place, and time. Nonfocal. Normal muscle tone.  Walks with a walker. ?PSYCHIATRIC: Normal mood and affect. Normal behavior. Cooperative ? ?CARDIAC DATABASE: ?EKG: ?09/10/2021: Sinus bradycardia, 48 bpm, old anteroseptal infarct, without underlying injury pattern. ? ?Echocardiogram: ?12/30/2020: ?Left ventricle cavity is normal in size.  Moderate concentric hypertrophy of the left ventricle. Normal global wall motion. Normal LV systolic function with EF 64%. Doppler evidence of grade II (pseudonormal) diastolic dysfunction, elevated LAP.  ?No sign

## 2022-01-10 DIAGNOSIS — I5032 Chronic diastolic (congestive) heart failure: Secondary | ICD-10-CM | POA: Diagnosis not present

## 2022-01-11 ENCOUNTER — Ambulatory Visit: Payer: Medicare Other | Admitting: Podiatry

## 2022-01-22 DIAGNOSIS — I272 Pulmonary hypertension, unspecified: Secondary | ICD-10-CM | POA: Diagnosis not present

## 2022-01-26 ENCOUNTER — Other Ambulatory Visit: Payer: Self-pay | Admitting: Cardiology

## 2022-01-26 DIAGNOSIS — I5032 Chronic diastolic (congestive) heart failure: Secondary | ICD-10-CM

## 2022-01-26 DIAGNOSIS — I10 Essential (primary) hypertension: Secondary | ICD-10-CM

## 2022-01-26 DIAGNOSIS — I2510 Atherosclerotic heart disease of native coronary artery without angina pectoris: Secondary | ICD-10-CM

## 2022-01-29 ENCOUNTER — Ambulatory Visit: Payer: Medicare Other | Admitting: Nurse Practitioner

## 2022-02-05 DIAGNOSIS — G4733 Obstructive sleep apnea (adult) (pediatric): Secondary | ICD-10-CM | POA: Diagnosis not present

## 2022-02-08 ENCOUNTER — Ambulatory Visit (INDEPENDENT_AMBULATORY_CARE_PROVIDER_SITE_OTHER): Payer: Medicare Other | Admitting: Pulmonary Disease

## 2022-02-08 ENCOUNTER — Encounter: Payer: Self-pay | Admitting: Pulmonary Disease

## 2022-02-08 ENCOUNTER — Other Ambulatory Visit: Payer: Self-pay

## 2022-02-08 ENCOUNTER — Telehealth: Payer: Self-pay | Admitting: Cardiology

## 2022-02-08 VITALS — BP 120/78 | HR 94 | Temp 98.9°F | Ht 62.0 in | Wt 260.0 lb

## 2022-02-08 DIAGNOSIS — G4733 Obstructive sleep apnea (adult) (pediatric): Secondary | ICD-10-CM

## 2022-02-08 DIAGNOSIS — Z9989 Dependence on other enabling machines and devices: Secondary | ICD-10-CM

## 2022-02-08 DIAGNOSIS — I5032 Chronic diastolic (congestive) heart failure: Secondary | ICD-10-CM

## 2022-02-08 MED ORDER — TRELEGY ELLIPTA 100-62.5-25 MCG/ACT IN AEPB: 1.0000 | INHALATION_SPRAY | Freq: Every day | RESPIRATORY_TRACT | 2 refills | Status: DC

## 2022-02-08 MED ORDER — DAPAGLIFLOZIN PROPANEDIOL 10 MG PO TABS: 10.0000 mg | ORAL_TABLET | Freq: Every day | ORAL | 3 refills | Status: DC

## 2022-02-08 NOTE — Telephone Encounter (Signed)
Medication has been refilled.

## 2022-02-08 NOTE — Telephone Encounter (Signed)
Patient requesting refill for Farxiga. Need this ASAP, 90 day supply at [facility].

## 2022-02-08 NOTE — Patient Instructions (Signed)
Continue using CPAP at night  Graded exercises as tolerated  Try and maintain regular activities as tolerated  I will see you back in about 6 months  Continue Trelegy Albuterol as needed  Call with significant concerns

## 2022-02-08 NOTE — Progress Notes (Signed)
Laurie Wall    [id number]    07-19-1956  Primary Care Physician:Husain, Jerelyn Scott, MD  Referring Physician: Georgann Housekeeper, MD [address] [place],  [place] [address]  Chief complaint:   In for follow-up for shortness of breath, obstructive sleep apnea Trying to get used to using CPAP on a nightly basis  HPI:  Recently suffered a bereavement Lost her daughter -This did affect her CPAP Compliance  History of obstructive lung disease -On bronchodilators  Recent cough -Largely nonproductive  History of obstructive sleep apnea -Has been trying to use CPAP on a nightly basis  History of pulmonary hypertension  History of coronary artery disease Multivessel coronary artery disease  History of CVA January 2021 History of seizures January 2021  She has been doing relatively well  Tries to remain active, needs a rollator to ambulate  Her sister is very involved in her care and helps her with activities and medications  She does have significant dysarthria  Uses oxygen regularly Has not been compliant with CPAP  Relocated from [place], [place]  Quit smoking about 21 months ago, 30-pack-year smoking history  No pertinent occupational history, no pets, no recent travel  Outpatient Encounter Medications as of 02/08/2022  Medication Sig   albuterol (VENTOLIN HFA) 108 (90 Base) MCG/ACT inhaler Inhale 2 puffs into the lungs every 6 (six) hours as needed for wheezing or shortness of breath.   aspirin 81 MG chewable tablet 1 tablet   carvedilol (COREG) 6.25 MG tablet TAKE 1 TABLET(6.25 MG) BY MOUTH TWICE DAILY WITH A MEAL   Cholecalciferol (VITAMIN D3) 50 MCG (2000 UT) capsule Take 2,000 Units by mouth daily.   dapagliflozin propanediol (FARXIGA) 10 MG TABS tablet Take 1 tablet (10 mg total) by mouth daily before breakfast.   divalproex (DEPAKOTE ER) 500 MG 24 hr tablet Take 2 tablets (1,000 mg total) by mouth at bedtime.   Ferrous  Sulfate (IRON) 325 (65 Fe) MG TABS 1 tablet   Fluticasone-Umeclidin-Vilant (TRELEGY ELLIPTA) 100-62.5-25 MCG/ACT AEPB Inhale 1 puff into the lungs daily.   gabapentin (NEURONTIN) 100 MG capsule Take 2 capsules (200 mg total) by mouth 2 (two) times daily.   isosorbide-hydrALAZINE (BIDIL) 20-37.5 MG tablet Take 1 tablet by mouth 3 (three) times daily.   Misc. Devices (BARIATRIC ROLLATOR) MISC 1 application by Does not apply route daily.   Misc. Devices (COMMODE BEDSIDE) MISC 1 application by Does not apply route daily.   omeprazole (PRILOSEC) 20 MG capsule 1 capsule   pyridOXINE (VITAMIN B-6) 100 MG tablet Take 100 mg by mouth daily.   rosuvastatin (CRESTOR) 20 MG tablet Take 1 tablet (20 mg total) by mouth at bedtime.   sacubitril-valsartan (ENTRESTO) 97-103 MG Take 1 tablet by mouth 2 (two) times daily.   spironolactone (ALDACTONE) 50 MG tablet Take 1 tablet (50 mg total) by mouth daily.   vitamin B-12 (CYANOCOBALAMIN) 500 MCG tablet Take 500 mcg by mouth daily.   [DISCONTINUED] Fluticasone-Umeclidin-Vilant (TRELEGY ELLIPTA) 100-62.5-25 MCG/INH AEPB 1 puff   nitroGLYCERIN (NITROSTAT) 0.4 MG SL tablet Place 1 tablet (0.4 mg total) under the tongue every 5 (five) minutes as needed for chest pain. If you require more than two tablets five minutes apart go to the nearest ER via EMS.   [DISCONTINUED] amLODipine (NORVASC) 10 MG tablet Take 10 mg by mouth daily.   [DISCONTINUED] sacubitril-valsartan (ENTRESTO) 49-51 MG Take 1 tablet by mouth 2 (two) times daily. (Patient taking differently: Take 2  tablets by mouth 2 (two) times daily.)   No facility-administered encounter medications on file as of 02/08/2022.    Allergies as of 02/08/2022 - Review Complete 02/08/2022  Allergen Reaction Noted   Lamotrigine Hives 01/30/2020   Ace inhibitors Cough 06/21/2019   Other Rash 06/07/2021   Oxycodone-acetaminophen Rash 06/21/2019   Oxycodone-aspirin Rash 06/21/2019    Past Medical History:  Diagnosis Date    CHF (congestive heart failure) (HCC)    COPD (chronic obstructive pulmonary disease) (HCC)    Coronary artery disease    Hyperlipidemia    Hypertension    NSTEMI (non-ST elevated myocardial infarction) (HCC)    OSA on CPAP    Oxygen dependent    Pulmonary hypertension (HCC)    Seizure (HCC)    Stroke (HCC)     Past Surgical History:  Procedure Laterality Date   CARDIAC CATHETERIZATION     MASTECTOMY Bilateral    TUBAL LIGATION      Family History  Problem Relation Age of Onset   Heart attack Mother    Heart attack Father    Asthma Sister    Hyperlipidemia Sister    Hypertension Brother    Hyperlipidemia Sister     Social History   Socioeconomic History   Marital status: Single    Spouse name: Not on file   Number of children: 1   Years of education: Not on file   Highest education level: Not on file  Occupational History   Not on file  Tobacco Use   Smoking status: Former    Packs/day: 1.00    Years: 30.00    Pack years: 30.00    Types: Cigarettes    Start date: 56    Quit date: 09/21/2019    Years since quitting: 2.3   Smokeless tobacco: Never  Vaping Use   Vaping Use: Never used  Substance and Sexual Activity   Alcohol use: Never   Drug use: Never   Sexual activity: Not on file  Other Topics Concern   Not on file  Social History Narrative   Lives alone   Right Handed   Drinks 1-2 cups caffeine daily    Social Determinants of Health   Financial Resource Strain: Not on file  Food Insecurity: Not on file  Transportation Needs: Not on file  Physical Activity: Not on file  Stress: Not on file  Social Connections: Not on file  Intimate Partner Violence: Not on file    Review of Systems  Constitutional:  Negative for fever.  Respiratory:  Positive for cough and shortness of breath.   Cardiovascular:  Negative for chest pain.  Psychiatric/Behavioral:  Positive for sleep disturbance.    Vitals:   02/08/22 1007  BP: 120/78  Pulse: 94   Temp: 98.9 F (37.2 C)  SpO2: 96%      Physical Exam Constitutional:      Appearance: She is obese.  HENT:     Head: Normocephalic and atraumatic.  Eyes:     General:        Right eye: No discharge.        Left eye: No discharge.  Cardiovascular:     Rate and Rhythm: Normal rate and regular rhythm.     Pulses: Normal pulses.     Heart sounds: Normal heart sounds. No murmur heard. Pulmonary:     Effort: Pulmonary effort is normal. No respiratory distress.     Breath sounds: No stridor. No wheezing or rhonchi.  Musculoskeletal:  Cervical back: No rigidity or tenderness.  Neurological:     Mental Status: She is alert.  Psychiatric:        Mood and Affect: Mood normal.     Data Reviewed: Care everywhere visits reviewed  CPAP compliance reveals 23% compliance Average use on days used 4 hours 19 minutes CPAP of 12 AHI of 10.8 Issues with mask leaks  Echocardiogram shows diastolic dysfunction shows, ejection fraction of 64%  Assessment:  Pulmonary hypertension -Continue current care  Obstructive sleep apnea -Encouraged to work on using CPAP on a nightly basis  Chronic obstructive pulmonary disease -On Trelegy and albuterol  Chronic respiratory failure on oxygen supplementation -Continue oxygen -Encouraged to keep oxygen on most of the time -Check pulse oximetry frequently to assess oxygen needs  Deconditioning -Encouraged to continue exercises as tolerated  History of diastolic heart failure Coronary artery disease   Plan/Recommendations:  .  Continue CPAP nightly  .  Continue inhalers  .  Continue oxygen supplementation  .  Continue weight loss efforts  .  Follow-up in about 6 months   Virl DiamondAdewale Mckenley Birenbaum MD [facility] 02/08/2022, 10:23 AM  CC: Georgann HousekeeperHusain, Karrar, MD

## 2022-02-10 ENCOUNTER — Telehealth: Payer: Self-pay | Admitting: Neurology

## 2022-02-10 NOTE — Telephone Encounter (Signed)
Rescheduled 8/8 appt with pt's sister over the phone- Dr. Terrace Arabia out.

## 2022-02-12 ENCOUNTER — Other Ambulatory Visit: Payer: Self-pay

## 2022-02-12 ENCOUNTER — Ambulatory Visit (INDEPENDENT_AMBULATORY_CARE_PROVIDER_SITE_OTHER): Payer: Medicare Other | Admitting: Podiatry

## 2022-02-12 DIAGNOSIS — M79675 Pain in left toe(s): Secondary | ICD-10-CM

## 2022-02-12 DIAGNOSIS — I5032 Chronic diastolic (congestive) heart failure: Secondary | ICD-10-CM

## 2022-02-12 DIAGNOSIS — M79674 Pain in right toe(s): Secondary | ICD-10-CM

## 2022-02-12 DIAGNOSIS — B351 Tinea unguium: Secondary | ICD-10-CM | POA: Diagnosis not present

## 2022-02-12 MED ORDER — DAPAGLIFLOZIN PROPANEDIOL 10 MG PO TABS: 10.0000 mg | ORAL_TABLET | Freq: Every day | ORAL | 3 refills | Status: AC

## 2022-02-14 NOTE — Progress Notes (Signed)
Subjective: 66 y.o. returns the office today for painful, elongated, thickened toenails which she cannot trim herself. Denies any redness or drainage around the nails.  No ulcerations she reports.  She has no other concerns today. Denies any systemic complaints such as fevers, chills.    PCP: Georgann Housekeeper, MD  Objective: AAO 3, NAD DP/PT pulses palpable, CRT less than 3 seconds Nails hypertrophic, dystrophic, elongated, brittle, discolored 10. There is tenderness overlying the nails 1-5 bilaterally. There is no surrounding erythema or drainage along the nail sites. Dry skin is present with any open sores. No open lesions or pre-ulcerative lesions are identified. No, dry skin.  with calf compression, swelling, warmth, erythema.  Assessment: Patient presents with symptomatic onychomycosis  Plan: -Treatment options including alternatives, risks, complications were discussed -Nails sharply debrided 10 without complication/bleeding.  Again discussed treatment options for nail fungus. -Moisturizer daily but do not apply interdigitally. -Discussed daily foot inspection. If there are any changes, to call the office immediately.  -Follow-up in 3 months or sooner if any problems are to arise. In the meantime, encouraged to call the office with any questions, concerns, changes symptoms.  Ovid Curd, DPM

## 2022-02-22 DIAGNOSIS — I272 Pulmonary hypertension, unspecified: Secondary | ICD-10-CM | POA: Diagnosis not present

## 2022-03-15 ENCOUNTER — Other Ambulatory Visit: Payer: Self-pay | Admitting: Cardiology

## 2022-03-15 DIAGNOSIS — I5032 Chronic diastolic (congestive) heart failure: Secondary | ICD-10-CM

## 2022-03-24 DIAGNOSIS — I272 Pulmonary hypertension, unspecified: Secondary | ICD-10-CM | POA: Diagnosis not present

## 2022-03-30 DIAGNOSIS — G629 Polyneuropathy, unspecified: Secondary | ICD-10-CM | POA: Diagnosis not present

## 2022-03-30 DIAGNOSIS — I2510 Atherosclerotic heart disease of native coronary artery without angina pectoris: Secondary | ICD-10-CM | POA: Diagnosis not present

## 2022-03-30 DIAGNOSIS — I270 Primary pulmonary hypertension: Secondary | ICD-10-CM | POA: Diagnosis not present

## 2022-03-30 DIAGNOSIS — R569 Unspecified convulsions: Secondary | ICD-10-CM | POA: Diagnosis not present

## 2022-03-30 DIAGNOSIS — E559 Vitamin D deficiency, unspecified: Secondary | ICD-10-CM | POA: Diagnosis not present

## 2022-03-30 DIAGNOSIS — Z853 Personal history of malignant neoplasm of breast: Secondary | ICD-10-CM | POA: Diagnosis not present

## 2022-03-30 DIAGNOSIS — I10 Essential (primary) hypertension: Secondary | ICD-10-CM | POA: Diagnosis not present

## 2022-03-30 DIAGNOSIS — G4733 Obstructive sleep apnea (adult) (pediatric): Secondary | ICD-10-CM | POA: Diagnosis not present

## 2022-03-30 DIAGNOSIS — Z0000 Encounter for general adult medical examination without abnormal findings: Secondary | ICD-10-CM | POA: Diagnosis not present

## 2022-03-30 DIAGNOSIS — E785 Hyperlipidemia, unspecified: Secondary | ICD-10-CM | POA: Diagnosis not present

## 2022-03-30 DIAGNOSIS — J9611 Chronic respiratory failure with hypoxia: Secondary | ICD-10-CM | POA: Diagnosis not present

## 2022-04-01 DIAGNOSIS — Z780 Asymptomatic menopausal state: Secondary | ICD-10-CM | POA: Diagnosis not present

## 2022-04-02 ENCOUNTER — Other Ambulatory Visit: Payer: Self-pay | Admitting: Cardiology

## 2022-04-02 DIAGNOSIS — I5032 Chronic diastolic (congestive) heart failure: Secondary | ICD-10-CM

## 2022-04-02 DIAGNOSIS — E782 Mixed hyperlipidemia: Secondary | ICD-10-CM

## 2022-04-05 ENCOUNTER — Other Ambulatory Visit: Payer: Self-pay

## 2022-04-05 ENCOUNTER — Telehealth: Payer: Self-pay | Admitting: Cardiology

## 2022-04-05 DIAGNOSIS — I5032 Chronic diastolic (congestive) heart failure: Secondary | ICD-10-CM

## 2022-04-05 DIAGNOSIS — E782 Mixed hyperlipidemia: Secondary | ICD-10-CM

## 2022-04-05 MED ORDER — ROSUVASTATIN CALCIUM 20 MG PO TABS: 20.0000 mg | ORAL_TABLET | Freq: Every day | ORAL | 0 refills | Status: DC

## 2022-04-05 MED ORDER — ISOSORB DINITRATE-HYDRALAZINE 20-37.5 MG PO TABS: 1.0000 | ORAL_TABLET | Freq: Three times a day (TID) | ORAL | 3 refills | Status: DC

## 2022-04-05 NOTE — Telephone Encounter (Signed)
Ok done

## 2022-04-05 NOTE — Telephone Encounter (Signed)
Patient is switching insurance after today, needs refill for rosuvastatin & isosorbide-hydralazine today. Please call her sister [name] at [phone] to let her know when it has been completed.

## 2022-04-07 ENCOUNTER — Ambulatory Visit: Payer: HMO | Admitting: Cardiology

## 2022-04-07 ENCOUNTER — Encounter: Payer: Self-pay | Admitting: Cardiology

## 2022-04-07 VITALS — BP 158/80 | HR 60 | Temp 98.0°F | Resp 16 | Ht 62.0 in | Wt 254.6 lb

## 2022-04-07 DIAGNOSIS — Z789 Other specified health status: Secondary | ICD-10-CM

## 2022-04-07 DIAGNOSIS — I5032 Chronic diastolic (congestive) heart failure: Secondary | ICD-10-CM | POA: Diagnosis not present

## 2022-04-07 DIAGNOSIS — I252 Old myocardial infarction: Secondary | ICD-10-CM | POA: Diagnosis not present

## 2022-04-07 DIAGNOSIS — G4733 Obstructive sleep apnea (adult) (pediatric): Secondary | ICD-10-CM | POA: Diagnosis not present

## 2022-04-07 DIAGNOSIS — I10 Essential (primary) hypertension: Secondary | ICD-10-CM | POA: Diagnosis not present

## 2022-04-07 DIAGNOSIS — Z8673 Personal history of transient ischemic attack (TIA), and cerebral infarction without residual deficits: Secondary | ICD-10-CM | POA: Diagnosis not present

## 2022-04-07 DIAGNOSIS — E782 Mixed hyperlipidemia: Secondary | ICD-10-CM

## 2022-04-07 DIAGNOSIS — I2510 Atherosclerotic heart disease of native coronary artery without angina pectoris: Secondary | ICD-10-CM | POA: Diagnosis not present

## 2022-04-07 DIAGNOSIS — Z9989 Dependence on other enabling machines and devices: Secondary | ICD-10-CM | POA: Diagnosis not present

## 2022-04-07 DIAGNOSIS — I272 Pulmonary hypertension, unspecified: Secondary | ICD-10-CM

## 2022-04-07 DIAGNOSIS — Z6841 Body Mass Index (BMI) 40.0 and over, adult: Secondary | ICD-10-CM | POA: Diagnosis not present

## 2022-04-07 MED ORDER — ROSUVASTATIN CALCIUM 20 MG PO TABS: 20.0000 mg | ORAL_TABLET | Freq: Every day | ORAL | 0 refills | Status: AC

## 2022-04-07 NOTE — Progress Notes (Signed)
ID:  Laurie Wall, DOB 09-23-1955, MRN [id number]  PCP: Wenda Low, MD  Cardiologist:  Rex Kras, DO, [facility] (established care 11/18/2020) Former Cardiology Providers: Caren Hazy MD, Dr. Elenore Paddy    Date: 04/07/22 Last Office Visit: 01/05/2022  Chief Complaint  Patient presents with   Coronary Artery Disease   heart failure management   Hypertension   Follow-up    HPI  Laurie Wall is a 66 y.o. female whose past medical history and cardiovascular risk factors include: History of non-STEMI, history of stroke, heart failure with preserved EF, three-vessel coronary artery disease, hyperlipidemia, hypertension, OSA on CPAP, pulmonary hypertension, COPD with oxygen dependence 2 L, postmenopausal female, advanced age, obesity.  Patient was referred to the practice after moving to [facility] from [facility] for management of CAD, recent stroke, and history of non-STEMI.  She is accompanied by her sister Laurie Wall who can be reached at [phone].  Patient provides verbal consent in regards to discussing her medical information in her presence.  In January 2021 when she presented to [facility] as a non-STEMI underwent angiography and was noted to have multivessel CAD.  Since she also had a stroke during the same hospitalization this shared decision was to treat her CAD medically with close follow-up.  She establish care with our practice in March 2022 and since then she has been uptitrated on GDMT.   She now presents for 60-monthfollow-up visit for blood pressure management.  Since last visit patient's son passed and now is taking care of her granddaughter.  She is still grieving and under a lot of stress.  She does check her home blood pressures regularly with a wrist cuff and her SBP ranges between 120-130 mmHg.  She is lost approximately 3 pounds since last office visit as well.  She denies angina pectoris or heart failure symptoms.  She is requiring the same amount of  nasal cannula oxygen since last visit.   Family history of premature coronary disease but no sudden cardiac death.  Brother had a myocardial infarction at the age of 547  FUNCTIONAL STATUS: No structured exercise program or daily routine.   ALLERGIES: Allergies  Allergen Reactions   Lamotrigine Hives   Ace Inhibitors Cough   Other Rash    Percodan    Oxycodone-Acetaminophen Rash    Other reaction(s): rash Other reaction(s): rash   Oxycodone-Aspirin Rash    Other reaction(s): rash    MEDICATION LIST PRIOR TO VISIT: Current Meds  Medication Sig   albuterol (VENTOLIN HFA) 108 (90 Base) MCG/ACT inhaler Inhale 2 puffs into the lungs every 6 (six) hours as needed for wheezing or shortness of breath.   aspirin 81 MG chewable tablet 1 tablet   carvedilol (COREG) 6.25 MG tablet TAKE 1 TABLET(6.25 MG) BY MOUTH TWICE DAILY WITH A MEAL   Cholecalciferol (VITAMIN D3) 50 MCG (2000 UT) capsule Take 2,000 Units by mouth daily.   dapagliflozin propanediol (FARXIGA) 10 MG TABS tablet Take 1 tablet (10 mg total) by mouth daily before breakfast.   divalproex (DEPAKOTE ER) 500 MG 24 hr tablet Take 2 tablets (1,000 mg total) by mouth at bedtime.   Ferrous Sulfate (IRON) 325 (65 Fe) MG TABS 1 tablet   Fluticasone-Umeclidin-Vilant (TRELEGY ELLIPTA) 100-62.5-25 MCG/ACT AEPB Inhale 1 puff into the lungs daily.   gabapentin (NEURONTIN) 100 MG capsule Take 2 capsules (200 mg total) by mouth 2 (two) times daily. (Patient taking differently: Take 200 mg by mouth at bedtime.)   isosorbide-hydrALAZINE (BIDIL) 20-37.5 MG  tablet Take 1 tablet by mouth 3 (three) times daily.   Misc. Devices (BARIATRIC ROLLATOR) MISC 1 application by Does not apply route daily.   Misc. Devices (COMMODE BEDSIDE) MISC 1 application by Does not apply route daily.   nitroGLYCERIN (NITROSTAT) 0.4 MG SL tablet Place 1 tablet (0.4 mg total) under the tongue every 5 (five) minutes as needed for chest pain. If you require more than two  tablets five minutes apart go to the nearest ER via EMS.   omeprazole (PRILOSEC) 20 MG capsule 1 capsule   pyridOXINE (VITAMIN B-6) 100 MG tablet Take 100 mg by mouth daily.   sacubitril-valsartan (ENTRESTO) 97-103 MG Take 1 tablet by mouth 2 (two) times daily.   spironolactone (ALDACTONE) 50 MG tablet Take 1 tablet (50 mg total) by mouth daily.   vitamin B-12 (CYANOCOBALAMIN) 500 MCG tablet Take 500 mcg by mouth daily.   [DISCONTINUED] rosuvastatin (CRESTOR) 20 MG tablet Take 1 tablet (20 mg total) by mouth at bedtime.     PAST MEDICAL HISTORY: Past Medical History:  Diagnosis Date   CHF (congestive heart failure) (HCC)    COPD (chronic obstructive pulmonary disease) (HCC)    Coronary artery disease    Hyperlipidemia    Hypertension    NSTEMI (non-ST elevated myocardial infarction) ([place])    OSA on CPAP    Oxygen dependent    Pulmonary hypertension (HCC)    Seizure ([place])    Stroke ([place])     PAST SURGICAL HISTORY: Past Surgical History:  Procedure Laterality Date   CARDIAC CATHETERIZATION     MASTECTOMY Bilateral    TUBAL LIGATION      FAMILY HISTORY: The patient family history includes Asthma in her sister; Heart attack in her father and mother; Hyperlipidemia in her sister and sister; Hypertension in her brother.  SOCIAL HISTORY:  The patient  reports that she quit smoking about 2 years ago. Her smoking use included cigarettes. She started smoking about 53 years ago. She has a 30.00 pack-year smoking history. She has never used smokeless tobacco. She reports that she does not drink alcohol and does not use drugs.  REVIEW OF SYSTEMS: Review of Systems  Constitutional: Negative for chills and fever.  HENT:  Negative for hoarse voice and nosebleeds.   Eyes:  Negative for discharge, double vision and pain.  Cardiovascular:  Negative for chest pain, claudication, dyspnea on exertion, leg swelling, near-syncope, orthopnea, palpitations, paroxysmal nocturnal dyspnea and syncope.   Respiratory:  Negative for hemoptysis and shortness of breath.   Musculoskeletal:  Positive for arthritis and muscle cramps. Negative for myalgias.  Gastrointestinal:  Negative for abdominal pain, constipation, diarrhea, hematemesis, hematochezia, melena, nausea and vomiting.  Neurological:  Positive for seizures. Negative for dizziness and light-headedness.       Speaking difficulties (expressive aphasia)    PHYSICAL EXAM:    04/07/2022   10:30 AM 04/07/2022   10:02 AM 02/08/2022   10:07 AM  Vitals with BMI  Height  5' 2"  5' 2"   Weight  254 lbs 10 oz 260 lbs  BMI  62.03 55.97  Systolic 416 384 536  Diastolic 80 71 78  Pulse 60 55 94    CONSTITUTIONAL: Appears older than stated age, nasal cannula oxygen, hemodynamically stable, no acute distress.    SKIN: Skin is warm and dry. No rash noted. No cyanosis. No pallor. No jaundice HEAD: Normocephalic and atraumatic.  EYES: No scleral icterus MOUTH/THROAT: Moist oral membranes.  NECK: No JVD present. No thyromegaly noted. No  carotid bruits  LYMPHATIC: No visible cervical adenopathy.  CHEST Normal respiratory effort. No intercostal retractions  LUNGS: Clear to auscultation bilaterally.  No stridor. No wheezes. No rales.  CARDIOVASCULAR: Regular, positive S1-S2, no murmurs rubs or gallops appreciated. ABDOMINAL: Obese, soft, nontender, nondistended, positive bowel sounds in all 4 quadrants, no apparent ascites.  EXTREMITIES: Trace bilateral peripheral edema, darker skin pigmentation to suggest chronic venous insufficiency, decreased bilateral DP and PT pulses. HEMATOLOGIC: No significant bruising NEUROLOGIC: Oriented to person, place, and time. Nonfocal. Normal muscle tone.  Walks with a walker. PSYCHIATRIC: Normal mood and affect. Normal behavior. Cooperative  CARDIAC DATABASE: EKG: 04/07/2022: Sinus bradycardia, 58 bpm, IVCD, nonspecific ST-T changes.  Echocardiogram: 12/30/2020: Left ventricle cavity is normal in size. Moderate  concentric hypertrophy of the left ventricle. Normal global wall motion. Normal LV systolic function with EF 64%. Doppler evidence of grade II (pseudonormal) diastolic dysfunction, elevated LAP.  No significant valvular abnormality. Normal right atrial pressure.   Stress Testing: No results found for this or any previous visit from the past 1095 days.  Heart Catheterization: [facility] 10/04/2019: 1. Normal LV filling pressures with mean PCWP of 8 mm Hg and LVEDP of 11 mm Hg  2. Normal LV systolic function  3. No significant renal artery stenosis  4. Multivessel coronary artery disease including 80% proximal LAD stenosis, 60% mid-circumflex stenosis, 50% OM1 stenosis, 60% OM2 stenosis and 95% mid-RCA stenosis  5. Pulmonary hypertension with PA pressure of 56/19 mm Hg with a mean of 32 mm Hg   LABORATORY DATA:    Latest Ref Rng & Units 06/07/2021    5:44 PM  CBC  WBC 4.0 - 10.5 K/uL 7.7   Hemoglobin 12.0 - 15.0 g/dL 12.3   Hematocrit 36.0 - 46.0 % 39.5   Platelets 150 - 400 K/uL 277        Latest Ref Rng & Units 12/17/2021    1:42 PM 12/04/2021   11:21 AM 11/12/2021   10:07 AM  CMP  Glucose 70 - 99 mg/dL 74  86  102   BUN 8 - 27 mg/dL 14  15  12    Creatinine 0.57 - 1.00 mg/dL 0.96  0.92  0.89   Sodium 134 - 144 mmol/L 142  142  145   Potassium 3.5 - 5.2 mmol/L 4.4  4.2  3.8   Chloride 96 - 106 mmol/L 101  101  104   CO2 20 - 29 mmol/L 29  30  30    Calcium 8.7 - 10.3 mg/dL 9.3  9.2  9.3     Lipid Panel  No results found for: "CHOL", "TRIG", "HDL", "CHOLHDL", "VLDL", "LDLCALC", "LDLDIRECT", "LABVLDL"  No components found for: "NTPROBNP" Recent Labs    09/02/21 1239 09/21/21 1300 10/20/21 1118 11/12/21 1013 12/04/21 1121  PROBNP 205 119 199 307* 203   No results for input(s): "TSH" in the last 8760 hours.  BMP Recent Labs    06/07/21 1744 09/02/21 1238 11/12/21 1007 12/04/21 1121 12/17/21 1342  NA 136   < > 145* 142 142  K 4.0   < > 3.8 4.2 4.4  CL 99   <  > 104 101 101  CO2 29   < > 30* 30* 29  GLUCOSE 91   < > 102* 86 74  BUN 11   < > 12 15 14   CREATININE 0.81   < > 0.89 0.92 0.96  CALCIUM 9.1   < > 9.3 9.2 9.3  GFRNONAA >60  --   --   --   --    < > =  values in this interval not displayed.   No results found for: "CHOL", "HDL", "[place]", "LDLDIRECT", "TRIG", "CHOLHDL"  HEMOGLOBIN A1C No results found for: "HGBA1C", "MPG"  External Labs: Collected: 11/13/2020 provided from care everywhere [facility]) Hemoglobin 12.9 g/dL, hematocrit 40.5% NT proBNP: 194 Creatinine 1 mg/dL. eGFR: 62 mL/min per 1.73 m Sodium 141, potassium 3.5, chloride 97, bicarb 35, AST 16, ALT 18, alkaline phosphatase 127 Lipid profile: Total cholesterol 154, triglycerides 75, HDL 57, LDL 82.  Hemoglobin A1c: 6.1 TSH: 1.53  IMPRESSION:    ICD-10-CM   1. Chronic heart failure with preserved ejection fraction (HCC)  I50.32 EKG 12-Lead    2. Pulmonary hypertension (HCC)  I27.20     3. Benign hypertension  I10     4. History of non-ST elevation myocardial infarction (NSTEMI)  I25.2     5. Atherosclerosis of native coronary artery of native heart without angina pectoris  I25.10     6. Hx of TIA (transient ischemic attack) and stroke  Z86.73     7. Mixed hyperlipidemia  E78.2 rosuvastatin (CRESTOR) 20 MG tablet    8. Statin intolerance  Z78.9     9. OSA on CPAP  G47.33    Z99.89     10. Class 3 severe obesity due to excess calories with serious comorbidity and body mass index (BMI) of 45.0 to 49.9 in adult [facility])  E66.01    Z68.42        RECOMMENDATIONS: Laurie Wall is a 66 y.o. female whose past medical history and cardiac risk factors include: History of non-STEMI, history of stroke, heart failure with preserved EF, three-vessel coronary artery disease, hyperlipidemia, hypertension, OSA on CPAP, pulmonary hypertension, COPD with oxygen dependence 2 L, postmenopausal female, advanced age, obesity.  Chronic heart failure with preserved  ejection fraction (HCC) Euvolemic. Stage C, NYHA class II/III. Tolerated initiation of BiDil well since last office visit. Home blood pressures are well controlled as post office readings.   She would like to continue current medical therapy without any changes. We discussed undergoing ischemic work-up with either nuclear stress test versus left and right heart catheterization given her history of CAD and pulmonary hypertension.  Patient would like to hold off on additional testing at this time. Strict I's and O's and daily weights. Medications reconciled.  Pulmonary hypertension: Last evaluated in January 2021 in the setting of CVA/active chest pain/heart failure symptoms. At that time her mean PAP 32 mmHg. Patient has not been on medical therapy for pulmonary hypertension. Since she is euvolemic recommended undergoing right heart catheterization to reevaluate her hemodynamics and consider therapy for PAH if still present. However, given the recent loss of her son and taking care of her granddaughter patient states that she is busy/stressed and would like to hold off for now.  Benign hypertension Has done well with the addition of BiDil. Home blood pressures are better controlled. Monitor for now.  Atherosclerosis of native coronary artery of native heart without angina pectoris/history of non-STEMI Non-STEMI January 2021-noted to have multivessel CAD. At the time of the angiography patient was also being treated for acute stroke and HFpEF and therefore medical management was recommended. Has tolerated the uptitration of GDMT and antianginal therapy and remains asymptomatic. As noted above ischemic work-up recommended but patient would like to hold off for now. No use of sublingual nitroglycerin tablets.  Mixed hyperlipidemia Currently on rosuvastatin.   She denies myalgia or other side effects. Refilled rosuvastatin. Patient states that she will have fasting lipid  profile checked  with her PCP.  FINAL MEDICATION LIST END OF ENCOUNTER: Meds ordered this encounter  Medications   rosuvastatin (CRESTOR) 20 MG tablet    Sig: Take 1 tablet (20 mg total) by mouth at bedtime.    Dispense:  90 tablet    Refill:  0   Medications Discontinued During This Encounter  Medication Reason   rosuvastatin (CRESTOR) 20 MG tablet Reorder      Current Outpatient Medications:    albuterol (VENTOLIN HFA) 108 (90 Base) MCG/ACT inhaler, Inhale 2 puffs into the lungs every 6 (six) hours as needed for wheezing or shortness of breath., Disp: 8 g, Rfl: 6   aspirin 81 MG chewable tablet, 1 tablet, Disp: , Rfl:    carvedilol (COREG) 6.25 MG tablet, TAKE 1 TABLET(6.25 MG) BY MOUTH TWICE DAILY WITH A MEAL, Disp: 180 tablet, Rfl: 2   Cholecalciferol (VITAMIN D3) 50 MCG (2000 UT) capsule, Take 2,000 Units by mouth daily., Disp: , Rfl:    dapagliflozin propanediol (FARXIGA) 10 MG TABS tablet, Take 1 tablet (10 mg total) by mouth daily before breakfast., Disp: 90 tablet, Rfl: 3   divalproex (DEPAKOTE ER) 500 MG 24 hr tablet, Take 2 tablets (1,000 mg total) by mouth at bedtime., Disp: 60 tablet, Rfl: 11   Ferrous Sulfate (IRON) 325 (65 Fe) MG TABS, 1 tablet, Disp: , Rfl:    Fluticasone-Umeclidin-Vilant (TRELEGY ELLIPTA) 100-62.5-25 MCG/ACT AEPB, Inhale 1 puff into the lungs daily., Disp: 60 each, Rfl: 2   gabapentin (NEURONTIN) 100 MG capsule, Take 2 capsules (200 mg total) by mouth 2 (two) times daily. (Patient taking differently: Take 200 mg by mouth at bedtime.), Disp: 360 capsule, Rfl: 1   isosorbide-hydrALAZINE (BIDIL) 20-37.5 MG tablet, Take 1 tablet by mouth 3 (three) times daily., Disp: 270 tablet, Rfl: 3   Misc. Devices (BARIATRIC ROLLATOR) MISC, 1 application by Does not apply route daily., Disp: 1 each, Rfl: 0   Misc. Devices (COMMODE BEDSIDE) MISC, 1 application by Does not apply route daily., Disp: 1 each, Rfl: 0   nitroGLYCERIN (NITROSTAT) 0.4 MG SL tablet, Place 1 tablet (0.4 mg total)  under the tongue every 5 (five) minutes as needed for chest pain. If you require more than two tablets five minutes apart go to the nearest ER via EMS., Disp: 30 tablet, Rfl: 0   omeprazole (PRILOSEC) 20 MG capsule, 1 capsule, Disp: , Rfl:    pyridOXINE (VITAMIN B-6) 100 MG tablet, Take 100 mg by mouth daily., Disp: , Rfl:    sacubitril-valsartan (ENTRESTO) 97-103 MG, Take 1 tablet by mouth 2 (two) times daily., Disp: 180 tablet, Rfl: 3   spironolactone (ALDACTONE) 50 MG tablet, Take 1 tablet (50 mg total) by mouth daily., Disp: 90 tablet, Rfl: 3   vitamin B-12 (CYANOCOBALAMIN) 500 MCG tablet, Take 500 mcg by mouth daily., Disp: , Rfl:    rosuvastatin (CRESTOR) 20 MG tablet, Take 1 tablet (20 mg total) by mouth at bedtime., Disp: 90 tablet, Rfl: 0  Orders Placed This Encounter  Procedures   EKG 12-Lead    There are no Patient Instructions on file for this visit.   --Continue cardiac medications as reconciled in final medication list. --Return in about 6 months (around 10/08/2022) for Follow up, heart failure management., CAD. Or sooner if needed. --Continue follow-up with your primary care physician regarding the management of your other chronic comorbid conditions.  Patient's questions and concerns were addressed to her satisfaction. She voices understanding of the instructions provided during this encounter.  This note was created using a voice recognition software as a result there may be grammatical errors inadvertently enclosed that do not reflect the nature of this encounter. Every attempt is made to correct such errors.   Rex Kras, [place], [facility]  Pager: [phone] Office: [phone]

## 2022-04-09 ENCOUNTER — Telehealth: Payer: Self-pay | Admitting: Pulmonary Disease

## 2022-04-09 DIAGNOSIS — J441 Chronic obstructive pulmonary disease with (acute) exacerbation: Secondary | ICD-10-CM

## 2022-04-09 NOTE — Telephone Encounter (Signed)
Looked at last office note and patient was seen 02/2022. Patient was asking for supplies to be sent for her oxygen. Patient uses [facility]. Order made for supplies for oxygen. Patient is already established. Nothing further needed

## 2022-04-13 ENCOUNTER — Ambulatory Visit: Payer: Medicare Other | Admitting: Neurology

## 2022-04-15 ENCOUNTER — Telehealth: Payer: Self-pay | Admitting: Pulmonary Disease

## 2022-04-15 NOTE — Telephone Encounter (Signed)
Their supplies and pt has congestive heart failure and needs an order asap through [facility]. Please advise if this was completed. Pt sister said she wasn't called

## 2022-04-15 NOTE — Telephone Encounter (Signed)
Called and spoke with patient's sister Laurie Wall. She was calling to check on the status of the oxygen order being sent to [facility]. I advised her that the order was placed on 04/09/22 and sent to [facility]. She verbalized understanding.   Nothing further needed at time of call.

## 2022-04-20 ENCOUNTER — Ambulatory Visit: Payer: HMO | Admitting: Neurology

## 2022-04-20 ENCOUNTER — Encounter: Payer: Self-pay | Admitting: Neurology

## 2022-04-20 VITALS — BP 171/75 | HR 52 | Ht 62.0 in | Wt 262.0 lb

## 2022-04-20 DIAGNOSIS — R569 Unspecified convulsions: Secondary | ICD-10-CM | POA: Diagnosis not present

## 2022-04-20 DIAGNOSIS — I639 Cerebral infarction, unspecified: Secondary | ICD-10-CM

## 2022-04-20 MED ORDER — DIVALPROEX SODIUM ER 500 MG PO TB24: 500.0000 mg | ORAL_TABLET | Freq: Every day | ORAL | 4 refills | Status: AC

## 2022-04-20 MED ORDER — GABAPENTIN 100 MG PO CAPS: 200.0000 mg | ORAL_CAPSULE | Freq: Two times a day (BID) | ORAL | 11 refills | Status: AC

## 2022-04-20 NOTE — Progress Notes (Signed)
Chief Complaint  Patient presents with   Follow-up    Rm 14 with sister reports she has been doing ok. She and her sister feel like she is improving       ASSESSMENT AND PLAN  Laurie Wall is a 66 y.o. female History of stroke in January 2021  Was treated at [facility], MRI reported borderline stroke involving left bilateral frontal, also temporal (left MCA/PCA border zone) per report,  Ultrasound of carotid artery showed less than 40% stenosis of bilateral internal carotid artery, vertebral artery was not visible,  Multiple vascular risk factors, include hypertension, hyperlipidemia, longtime smoker, COPD obstructive sleep apnea, obesity  On aspirin 81 mg only, history of GI bleeding required blood transfusion in May 2021 Seizure  Only had 1 seizure when she had acute stroke in January 2021,  On stable dose of Depakote ER 500 mg 2  tablets daily, decrease to 1 tablet every night  Mood disorder  Keppra 750 twice a day because agitation, better with Depakote ER, will decrease to 1 tablet every night  Return to clinic in 1 year  DIAGNOSTIC DATA (LABS, IMAGING, TESTING) - I reviewed patient records, labs, notes, testing and imaging myself where available.  Laboratory evaluations July 2021: Hg 11.1,  CMP creat 0.77, LDL 72, Hep C , HIV, negative,   March 2022, Keppra level 38.4, June 2021 38.3  MRI brain on Jan 26th 2021 at [facility] Multifocal regions of restricted diffusion with corresponding T2/FLAIR hyperintensity involving the cortex and subcortical white matter in the cerebral hemisphere, predominantly in the left frontal and parietal lobes with additional involvement of the left temporal lobe. There is ill-defined enhancement involving the affected left frontal and parietal regions (17:17, 20).   Dural based, T1/T2 isointense, homogeneously enhancing mass in the left frontal lobe measures 0.9 x 0.9 cm (17:18). No midline shift. No hydrocephalus. No extra-axial fluid  collections are identified. No evidence of acute intracranial hemorrhage.    ECHO on August 2021    1. The left ventricle is normal in size with normal wall thickness.    2. The left ventricular systolic function is normal, LVEF is visually  estimated at 55-60%.    3. There is grade II diastolic dysfunction (elevated filling pressure).    4. The left atrium is mildly dilated in size.    5. The right ventricle is normal in size, with normal systolic function.    6. There is moderate pulmonary hypertension, estimated pulmonary artery  systolic pressure is 56 mmHg.    7. The right atrium is mildly dilated  in size.    US Carotid on Oct 03 2019: Right Carotid: There is evidence in the ICA of a less than 40% stenosis.                 Non-hemodynamically significant plaque noted in the CCA. Limited                 Study.  Left Carotid: There is evidence in the ICA of a less than 40% stenosis.                Non-hemodynamically significant plaque noted in the CCA. Limited                Study.  Vertebrals:  Bilateral vertebral arteries were not visualized.  Subclavians: Normal flow hemodynamics were seen in bilateral subclavian               arteries.  MEDICAL HISTORY:  Laurie Wall is a 66 year old female, seen in request by her primary care physician Dr. Georgann Housekeeper, for evaluation of stroke, seizure, initial evaluation was on April 14, 2021,  I reviewed and summarized the referring note. PMHX. HTN HLD. COPD, smoker in the past, quit in Jan 2021, before that 1ppd, oxygen dependent July 2021. OSA, CPAP, but not compliance with it.  Patient moved from [facility] to live with her sister, she suffered a stroke in January 2021, with residual right arm and leg weakness, reliant on a walker since then, also suffered aphasia, expressive more than comprehensive  Reviewed MRI of the brain report on October 02, 2019 at [facility], subacute appearing multiple focal left cerebral  infarction, possible MCA/PCAborder zone distribution, involving left frontal, parietal lobe, with additional involvement of left temporal lobe,  Ultrasound of carotid artery showed less than 40% stenosis of bilateral internal carotid artery, bilateral vertebral artery was not visualized  Sleep study on October 05, 2019, severe obstructive sleep apnea, associated with oxygen desaturation 77, I also reviewed description of sleep architecture, AHI 60/per hour  She also suffered GI bleeding, required blood transfusion in May 2021  She had a seizure following her stroke, has been taking Keppra now 750 mg twice a day, there was no recurrent seizure since,  Prior  to her stroke, she worked at a doctor's office  UPDATE Oct 13 2021: She is accompanied by her sister at today's clinical visit, overall she is doing very well, mild unsteady gait, aphasia has much improved, with deliberate effort, she can understand three-step commands, no recurrent seizure, but while taking Keppra 750 mg twice a day, noticed some mood change, get agitated easily  UPDATE April 20 2022: She is accompanied by her sister at today's visit, since Keppra was replaced with Depakote ER 500 mg 2 tablets every night in February 2023, she is doing great, no recurrent seizure, it has helped her mood as well, she just lost her daughter in May 2023, despite that, she was able to keep a good spirit, she now lives alone, sister check on her daily basis, continues struggle with word finding difficulties, denies significant muscle weakness   PHYSICAL EXAM:   Vitals:   04/20/22 1257  BP: (!) 171/75  Pulse: (!) 52  Weight: 262 lb (118.8 kg)  Height: 5\' 2"  (1.575 m)   Not recorded     Body mass index is 47.92 kg/m.  PHYSICAL EXAMNIATION:  Gen: NAD, conversant, well nourised, well groomed                     Cardiovascular: Regular rate rhythm, no peripheral edema, warm, nontender. Eyes: Conjunctivae clear without exudates or  hemorrhage Neck: Supple, no carotid bruits. Pulmonary: Clear to auscultation bilaterally   NEUROLOGICAL EXAM:  MENTAL STATUS: Speech/cognition: Deliberate effort, mild expressive and comprehensive aphasia,   CRANIAL NERVES: CN II: Visual fields are full to confrontation. Pupils are round equal and briskly reactive to light. CN III, IV, VI: extraocular movement are normal. No ptosis. CN V: Facial sensation is intact to light touch CN VII: Right lower face weakness CN VIII: Hearing is normal to causal conversation. CN IX, X: Phonation is normal. CN XI: Head turning and shoulder shrug are intact  MOTOR: Only slight right upper and lower extremity weakness,  REFLEXES: Slight hyperreflexia right side  SENSORY: Intact to light touch, pinprick and vibratory sensation are intact in fingers and toes.  COORDINATION: There is no trunk or  limb dysmetria noted.  GAIT/STANCE: Push on chair to get up from seated position, cautious, wide-based, carrying her oxygen tank  REVIEW OF SYSTEMS:  Full 14 system review of systems performed and notable only for as above All other review of systems were negative.   ALLERGIES: Allergies  Allergen Reactions   Lamotrigine Hives   Ace Inhibitors Cough   Other Rash    Percodan    Oxycodone-Acetaminophen Rash    Other reaction(s): rash Other reaction(s): rash   Oxycodone-Aspirin Rash    Other reaction(s): rash    HOME MEDICATIONS: Current Outpatient Medications  Medication Sig Dispense Refill   albuterol (VENTOLIN HFA) 108 (90 Base) MCG/ACT inhaler Inhale 2 puffs into the lungs every 6 (six) hours as needed for wheezing or shortness of breath. 8 g 6   aspirin 81 MG chewable tablet 1 tablet     carvedilol (COREG) 6.25 MG tablet TAKE 1 TABLET(6.25 MG) BY MOUTH TWICE DAILY WITH A MEAL 180 tablet 2   Cholecalciferol (VITAMIN D3) 50 MCG (2000 UT) capsule Take 2,000 Units by mouth daily.     dapagliflozin propanediol (FARXIGA) 10 MG TABS tablet  Take 1 tablet (10 mg total) by mouth daily before breakfast. 90 tablet 3   divalproex (DEPAKOTE ER) 500 MG 24 hr tablet Take 2 tablets (1,000 mg total) by mouth at bedtime. 60 tablet 11   Ferrous Sulfate (IRON) 325 (65 Fe) MG TABS 1 tablet     Fluticasone-Umeclidin-Vilant (TRELEGY ELLIPTA) 100-62.5-25 MCG/ACT AEPB Inhale 1 puff into the lungs daily. 60 each 2   gabapentin (NEURONTIN) 100 MG capsule Take 2 capsules (200 mg total) by mouth 2 (two) times daily. (Patient taking differently: Take 200 mg by mouth at bedtime.) 360 capsule 1   isosorbide-hydrALAZINE (BIDIL) 20-37.5 MG tablet Take 1 tablet by mouth 3 (three) times daily. 270 tablet 3   Misc. Devices (BARIATRIC ROLLATOR) MISC 1 application by Does not apply route daily. 1 each 0   Misc. Devices (COMMODE BEDSIDE) MISC 1 application by Does not apply route daily. 1 each 0   nitroGLYCERIN (NITROSTAT) 0.4 MG SL tablet Place 1 tablet (0.4 mg total) under the tongue every 5 (five) minutes as needed for chest pain. If you require more than two tablets five minutes apart go to the nearest ER via EMS. 30 tablet 0   omeprazole (PRILOSEC) 20 MG capsule 1 capsule     pyridOXINE (VITAMIN B-6) 100 MG tablet Take 100 mg by mouth daily.     rosuvastatin (CRESTOR) 20 MG tablet Take 1 tablet (20 mg total) by mouth at bedtime. 90 tablet 0   sacubitril-valsartan (ENTRESTO) 97-103 MG Take 1 tablet by mouth 2 (two) times daily. 180 tablet 3   spironolactone (ALDACTONE) 50 MG tablet Take 1 tablet (50 mg total) by mouth daily. 90 tablet 3   vitamin B-12 (CYANOCOBALAMIN) 500 MCG tablet Take 500 mcg by mouth daily.     No current facility-administered medications for this visit.    PAST MEDICAL HISTORY: Past Medical History:  Diagnosis Date   CHF (congestive heart failure) (HCC)    COPD (chronic obstructive pulmonary disease) (HCC)    Coronary artery disease    Hyperlipidemia    Hypertension    NSTEMI (non-ST elevated myocardial infarction) (HCC)    OSA on  CPAP    Oxygen dependent    Pulmonary hypertension (HCC)    Seizure (HCC)    Stroke (HCC)     PAST SURGICAL HISTORY: Past Surgical History:  Procedure Laterality  Date   CARDIAC CATHETERIZATION     MASTECTOMY Bilateral    TUBAL LIGATION      FAMILY HISTORY: Family History  Problem Relation Age of Onset   Heart attack Mother    Heart attack Father    Asthma Sister    Hyperlipidemia Sister    Hypertension Brother    Hyperlipidemia Sister     SOCIAL HISTORY: Social History   Socioeconomic History   Marital status: Single    Spouse name: Not on file   Number of children: 1   Years of education: Not on file   Highest education level: Not on file  Occupational History   Not on file  Tobacco Use   Smoking status: Former    Packs/day: 1.00    Years: 30.00    Total pack years: 30.00    Types: Cigarettes    Start date: 73    Quit date: 09/21/2019    Years since quitting: 2.5   Smokeless tobacco: Never  Vaping Use   Vaping Use: Never used  Substance and Sexual Activity   Alcohol use: Never   Drug use: Never   Sexual activity: Not on file  Other Topics Concern   Not on file  Social History Narrative   Lives alone   Right Handed   Drinks 1-2 cups caffeine daily    Social Determinants of Health   Financial Resource Strain: Not on file  Food Insecurity: Not on file  Transportation Needs: Not on file  Physical Activity: Not on file  Stress: Not on file  Social Connections: Not on file  Intimate Partner Violence: Not on file      Levert Feinstein, M.D. Ph.D.  [facility] [address] [place], [place] [address] Ph: [phone] Fax: [phone]  CC:  Georgann Housekeeper, MD [address] [place],  [place] [address]  Georgann Housekeeper, MD

## 2022-04-27 ENCOUNTER — Telehealth: Payer: Self-pay | Admitting: Pulmonary Disease

## 2022-04-27 NOTE — Telephone Encounter (Signed)
I called Melissa and there was no answer- LMTCB.

## 2022-04-27 NOTE — Telephone Encounter (Signed)
Called and spoke with Laurie Wall from [employer] stating that pt's sister Laurie Wall called them very upset as pt received supplies for O2 but what pt was actually needing was O2 from [employer] as pt's insurance is no longer covered with [employer] and [employer] is needing their equipment back. Laurie Wall stated that pt will need to have a new order placed but due to pt's appt being more than 30 days, pt will need to have an OV and be walked at that OV.  Called and spoke with pt's sister Laurie Wall letting her know about my conversation with [employer] and stated to her that we can get pt in to see Dr. Val Eagle tomorrow 8/23. Laurie Wall verbalized understanding and an appt was scheduled for pt with Dr. Val Eagle. Nothing further needed.

## 2022-04-27 NOTE — Telephone Encounter (Signed)
Called patients sister, [name], to change an appointment and [name] states patient has not heard from [employer] regarding oxygen. Informed [name] [employer] received the order on 8/8 and gave her the number to contact [employer]. Informed [name] to call our office back if she had any questions or concerns. [name] verbalized understanding.

## 2022-04-28 ENCOUNTER — Encounter: Payer: Self-pay | Admitting: Pulmonary Disease

## 2022-04-28 ENCOUNTER — Ambulatory Visit: Payer: HMO | Admitting: Pulmonary Disease

## 2022-04-28 ENCOUNTER — Other Ambulatory Visit: Payer: Self-pay | Admitting: Cardiovascular Disease

## 2022-04-28 VITALS — BP 120/80 | HR 65 | Temp 98.4°F | Ht 62.0 in | Wt 251.0 lb

## 2022-04-28 DIAGNOSIS — G4733 Obstructive sleep apnea (adult) (pediatric): Secondary | ICD-10-CM

## 2022-04-28 DIAGNOSIS — R0602 Shortness of breath: Secondary | ICD-10-CM

## 2022-04-28 NOTE — Patient Instructions (Signed)
Ambulatory pulse oxygen check  DME referral to [employer]  -Oxygen therapy -CPAP  Currently on a CPAP of 12  Follow-up in about 3 months  Encouraged to stay active Encourage weight loss efforts

## 2022-04-28 NOTE — Progress Notes (Signed)
Laurie Wall    [id number]    07-27-1956  Primary Care Physician:Husain, Jerelyn Scott, MD  Referring Physician: Georgann Housekeeper, MD [address] [place],  [place] [address]  Chief complaint:   In for follow-up for shortness of breath, obstructive sleep apnea Trying to get used to using CPAP on a nightly basis  HPI:  Has been doing relatively well since her last visit  History of obstructive lung disease-on bronchodilators  Occasional cough is nonproductive  Has been trying to stay more active History of obstructive sleep apnea -Try to get used to using CPAP on a nightly basis  History of pulmonary hypertension  History of coronary artery disease Multivessel coronary artery disease  History of CVA January 2021 History of seizures January 2021  Her sister is very involved in her care and helps her with activities and medications  She does have significant dysarthria -Uses oxygen regularly, on [employer]  Relocated from [place], [place]  Quit smoking about 21 months ago, 30-pack-year smoking history  No pertinent occupational history, no pets, no recent travel  Outpatient Encounter Medications as of 04/28/2022  Medication Sig   albuterol (VENTOLIN HFA) 108 (90 Base) MCG/ACT inhaler Inhale 2 puffs into the lungs every 6 (six) hours as needed for wheezing or shortness of breath.   aspirin 81 MG chewable tablet 1 tablet   carvedilol (COREG) 6.25 MG tablet TAKE 1 TABLET(6.25 MG) BY MOUTH TWICE DAILY WITH A MEAL   Cholecalciferol (VITAMIN D3) 50 MCG (2000 UT) capsule Take 2,000 Units by mouth daily.   dapagliflozin propanediol (FARXIGA) 10 MG TABS tablet Take 1 tablet (10 mg total) by mouth daily before breakfast.   divalproex (DEPAKOTE ER) 500 MG 24 hr tablet Take 1 tablet (500 mg total) by mouth at bedtime.   Ferrous Sulfate (IRON) 325 (65 Fe) MG TABS 1 tablet   Fluticasone-Umeclidin-Vilant (TRELEGY ELLIPTA) 100-62.5-25 MCG/ACT AEPB Inhale 1  puff into the lungs daily.   gabapentin (NEURONTIN) 100 MG capsule Take 2 capsules (200 mg total) by mouth 2 (two) times daily. (Patient taking differently: Take 100 mg by mouth daily. 1 tablet daily and PRN)   isosorbide-hydrALAZINE (BIDIL) 20-37.5 MG tablet Take 1 tablet by mouth 3 (three) times daily.   Misc. Devices (BARIATRIC ROLLATOR) MISC 1 application by Does not apply route daily.   Misc. Devices (COMMODE BEDSIDE) MISC 1 application by Does not apply route daily.   omeprazole (PRILOSEC) 20 MG capsule 1 capsule   pyridOXINE (VITAMIN B-6) 100 MG tablet Take 100 mg by mouth daily.   rosuvastatin (CRESTOR) 20 MG tablet Take 1 tablet (20 mg total) by mouth at bedtime.   sacubitril-valsartan (ENTRESTO) 97-103 MG Take 1 tablet by mouth 2 (two) times daily.   spironolactone (ALDACTONE) 50 MG tablet Take 1 tablet (50 mg total) by mouth daily.   vitamin B-12 (CYANOCOBALAMIN) 500 MCG tablet Take 500 mcg by mouth daily.   nitroGLYCERIN (NITROSTAT) 0.4 MG SL tablet Place 1 tablet (0.4 mg total) under the tongue every 5 (five) minutes as needed for chest pain. If you require more than two tablets five minutes apart go to the nearest ER via EMS.   [DISCONTINUED] sacubitril-valsartan (ENTRESTO) 49-51 MG Take 1 tablet by mouth 2 (two) times daily. (Patient taking differently: Take 2 tablets by mouth 2 (two) times daily.)   No facility-administered encounter medications on file as of 04/28/2022.    Allergies as of 04/28/2022 - Review Complete 04/28/2022  Allergen Reaction Noted   Lamotrigine Hives 01/30/2020   Ace inhibitors Cough 06/21/2019   Other Rash 06/07/2021   Oxycodone-acetaminophen Rash 06/21/2019   Oxycodone-aspirin Rash 06/21/2019    Past Medical History:  Diagnosis Date   CHF (congestive heart failure) (HCC)    COPD (chronic obstructive pulmonary disease) (HCC)    Coronary artery disease    Hyperlipidemia    Hypertension    NSTEMI (non-ST elevated myocardial infarction) (HCC)     OSA on CPAP    Oxygen dependent    Pulmonary hypertension (HCC)    Seizure (HCC)    Stroke (HCC)     Past Surgical History:  Procedure Laterality Date   CARDIAC CATHETERIZATION     MASTECTOMY Bilateral    TUBAL LIGATION      Family History  Problem Relation Age of Onset   Heart attack Mother    Heart attack Father    Asthma Sister    Hyperlipidemia Sister    Hypertension Brother    Hyperlipidemia Sister     Social History   Socioeconomic History   Marital status: Single    Spouse name: Not on file   Number of children: 1   Years of education: Not on file   Highest education level: Not on file  Occupational History   Not on file  Tobacco Use   Smoking status: Former    Packs/day: 1.00    Years: 30.00    Total pack years: 30.00    Types: Cigarettes    Start date: 18    Quit date: 09/21/2019    Years since quitting: 2.6   Smokeless tobacco: Never  Vaping Use   Vaping Use: Never used  Substance and Sexual Activity   Alcohol use: Never   Drug use: Never   Sexual activity: Not on file  Other Topics Concern   Not on file  Social History Narrative   Lives alone   Right Handed   Drinks 1-2 cups caffeine daily    Social Determinants of Health   Financial Resource Strain: Not on file  Food Insecurity: Not on file  Transportation Needs: Not on file  Physical Activity: Not on file  Stress: Not on file  Social Connections: Not on file  Intimate Partner Violence: Not on file    Review of Systems  Constitutional:  Negative for fever.  Respiratory:  Positive for cough and shortness of breath.   Cardiovascular:  Negative for chest pain.  Psychiatric/Behavioral:  Positive for sleep disturbance.     Vitals:   04/28/22 0912  BP: 120/80  Pulse: 65  Temp: 98.4 F (36.9 C)  SpO2: 98%      Physical Exam Constitutional:      Appearance: She is obese.  HENT:     Head: Normocephalic and atraumatic.  Eyes:     General:        Right eye: No discharge.         Left eye: No discharge.  Cardiovascular:     Rate and Rhythm: Normal rate and regular rhythm.     Pulses: Normal pulses.     Heart sounds: Normal heart sounds. No murmur heard. Pulmonary:     Effort: Pulmonary effort is normal. No respiratory distress.     Breath sounds: No stridor. No wheezing or rhonchi.  Musculoskeletal:     Cervical back: No rigidity or tenderness.  Neurological:     Mental Status: She is alert.  Psychiatric:  Mood and Affect: Mood normal.    Data Reviewed: Care everywhere visits reviewed  CPAP compliance not available today -She is trying to use it regularly  Echocardiogram shows diastolic dysfunction shows, ejection fraction of 64%  Assessment:  Pulmonary hypertension -Continue current care  Obstructive sleep apnea -Encouraged to continue to try to use CPAP on a nightly basis  Chronic obstructive pulmonary disease -Continue Trelegy -Albuterol as needed  Chronic respiratory failure on oxygen supplementation -Continue oxygen use -We will ambulate today to qualify for POC  Deconditioning -Encouraged regular exercise as tolerated  History of diastolic heart failure Coronary artery disease -Continue lines of care  Plan/Recommendations:  .  CPAP on a nightly basis .  Continue current inhalers .  Continue oxygen supplementation .  Continue weight loss efforts .  Follow-up in 3 months   Virl Diamond MD [facility] 04/28/2022, 9:14 AM  CC: Georgann Housekeeper, MD

## 2022-04-29 ENCOUNTER — Telehealth: Payer: Self-pay | Admitting: Pulmonary Disease

## 2022-04-29 DIAGNOSIS — I611 Nontraumatic intracerebral hemorrhage in hemisphere, cortical: Secondary | ICD-10-CM | POA: Diagnosis not present

## 2022-04-29 DIAGNOSIS — I272 Pulmonary hypertension, unspecified: Secondary | ICD-10-CM | POA: Diagnosis not present

## 2022-04-29 DIAGNOSIS — I214 Non-ST elevation (NSTEMI) myocardial infarction: Secondary | ICD-10-CM | POA: Diagnosis not present

## 2022-04-29 DIAGNOSIS — J9610 Chronic respiratory failure, unspecified whether with hypoxia or hypercapnia: Secondary | ICD-10-CM | POA: Diagnosis not present

## 2022-04-29 DIAGNOSIS — M6281 Muscle weakness (generalized): Secondary | ICD-10-CM | POA: Diagnosis not present

## 2022-04-29 DIAGNOSIS — J449 Chronic obstructive pulmonary disease, unspecified: Secondary | ICD-10-CM | POA: Diagnosis not present

## 2022-04-29 DIAGNOSIS — G4733 Obstructive sleep apnea (adult) (pediatric): Secondary | ICD-10-CM | POA: Diagnosis not present

## 2022-04-29 NOTE — Telephone Encounter (Signed)
Called and spoke with patient's daughter [name]) [place], she advised me that her sister did receive her POC today.  Nothing further needed.   Melissa from [employer] returned my call and verified that a POC was delivered today as well as a back up tank.

## 2022-04-29 NOTE — Telephone Encounter (Signed)
[facility] Laurie Wall x1.  Left VM for Laurie Wall from [employer] to get clarification on what equipment is being delivered today.  Are the delivering a POC today or does it have to be ordered?  I Left patient's sister's # for her as well.    Will await return call.  Called and spoke with patient's sister [name]) [place], she states that she told [employer] to  hold off on bringing out the oxygen until the insurance did a PA.  She said she held them off until lunch time today and are bringing the stationary tank and what sounds like to her a canister tank that has to be pulled around.  She states that she told everyone that her sister is not going to sit a home and she cannot pull around that heavy tank.  I advised her I would call our contact at [employer] to get clarification as to what they are bringing out to the home.  The patient is expecting a POC as this is the only thing she can carry.  I told her that I was was not sure if they had the POC's readily available or if they had to be ordered.  I advised her I would call her back after I spoke with someone at [employer].

## 2022-05-03 ENCOUNTER — Other Ambulatory Visit: Payer: Self-pay | Admitting: Pulmonary Disease

## 2022-05-21 ENCOUNTER — Ambulatory Visit: Payer: HMO | Admitting: Podiatry

## 2022-05-21 DIAGNOSIS — B351 Tinea unguium: Secondary | ICD-10-CM | POA: Diagnosis not present

## 2022-05-21 DIAGNOSIS — M79674 Pain in right toe(s): Secondary | ICD-10-CM | POA: Diagnosis not present

## 2022-05-21 DIAGNOSIS — M79675 Pain in left toe(s): Secondary | ICD-10-CM

## 2022-05-21 NOTE — Progress Notes (Unsigned)
Subjective: 66 y.o. returns the office today for painful, elongated, thickened toenails which she cannot trim herself. Denies any redness or drainage around the nails.  No ulcerations she reports.  She has no other concerns today. Denies any systemic complaints such as fevers, chills.    PCP: Georgann Housekeeper, MD  Objective: AAO 3,  DP/PT pulses palpable, CRT less than 3 seconds Nails hypertrophic, dystrophic, elongated, brittle, discolored 10. There is tenderness overlying the nails 1-5 bilaterally. There is no surrounding erythema or drainage along the nail sites. Dry skin is present with any open sores. No open lesions b/l.  No, dry skin.  with calf compression, swelling, warmth, erythema.  Assessment: Patient presents with symptomatic onychomycosis  Plan: -Treatment options including alternatives, risks, complications were discussed -Nails sharply debrided 10 without complication/bleeding.  Again discussed treatment options for nail fungus. -Discussed daily foot inspection. If there are any changes, to call the office immediately.  -Follow-up in 3 months or sooner if any problems are to arise. In the meantime, encouraged to call the office with any questions, concerns, changes symptoms.  Ovid Curd, DPM

## 2022-05-25 DIAGNOSIS — G4733 Obstructive sleep apnea (adult) (pediatric): Secondary | ICD-10-CM | POA: Diagnosis not present

## 2022-05-30 DIAGNOSIS — I272 Pulmonary hypertension, unspecified: Secondary | ICD-10-CM | POA: Diagnosis not present

## 2022-05-30 DIAGNOSIS — G4733 Obstructive sleep apnea (adult) (pediatric): Secondary | ICD-10-CM | POA: Diagnosis not present

## 2022-05-30 DIAGNOSIS — J449 Chronic obstructive pulmonary disease, unspecified: Secondary | ICD-10-CM | POA: Diagnosis not present

## 2022-05-30 DIAGNOSIS — M6281 Muscle weakness (generalized): Secondary | ICD-10-CM | POA: Diagnosis not present

## 2022-05-30 DIAGNOSIS — I611 Nontraumatic intracerebral hemorrhage in hemisphere, cortical: Secondary | ICD-10-CM | POA: Diagnosis not present

## 2022-05-30 DIAGNOSIS — J9610 Chronic respiratory failure, unspecified whether with hypoxia or hypercapnia: Secondary | ICD-10-CM | POA: Diagnosis not present

## 2022-05-30 DIAGNOSIS — I214 Non-ST elevation (NSTEMI) myocardial infarction: Secondary | ICD-10-CM | POA: Diagnosis not present

## 2022-06-12 ENCOUNTER — Other Ambulatory Visit: Payer: Self-pay | Admitting: Cardiology

## 2022-06-12 DIAGNOSIS — I5032 Chronic diastolic (congestive) heart failure: Secondary | ICD-10-CM

## 2022-06-29 DIAGNOSIS — G4733 Obstructive sleep apnea (adult) (pediatric): Secondary | ICD-10-CM | POA: Diagnosis not present

## 2022-06-29 DIAGNOSIS — I214 Non-ST elevation (NSTEMI) myocardial infarction: Secondary | ICD-10-CM | POA: Diagnosis not present

## 2022-06-29 DIAGNOSIS — I611 Nontraumatic intracerebral hemorrhage in hemisphere, cortical: Secondary | ICD-10-CM | POA: Diagnosis not present

## 2022-06-29 DIAGNOSIS — J449 Chronic obstructive pulmonary disease, unspecified: Secondary | ICD-10-CM | POA: Diagnosis not present

## 2022-06-29 DIAGNOSIS — J9610 Chronic respiratory failure, unspecified whether with hypoxia or hypercapnia: Secondary | ICD-10-CM | POA: Diagnosis not present

## 2022-06-29 DIAGNOSIS — I272 Pulmonary hypertension, unspecified: Secondary | ICD-10-CM | POA: Diagnosis not present

## 2022-06-29 DIAGNOSIS — M6281 Muscle weakness (generalized): Secondary | ICD-10-CM | POA: Diagnosis not present

## 2022-07-09 ENCOUNTER — Ambulatory Visit: Payer: HMO

## 2022-07-09 VITALS — BP 136/78 | HR 65 | Resp 16 | Ht 62.0 in | Wt 251.0 lb

## 2022-07-09 DIAGNOSIS — I10 Essential (primary) hypertension: Secondary | ICD-10-CM

## 2022-07-09 NOTE — Progress Notes (Signed)
ID:  Laurie Wall, DOB 07/30/1956, MRN [id number]  PCP: Wenda Low, MD  Cardiologist:  Rex Kras, DO, [facility] (established care 11/18/2020) Former Cardiology Providers: Caren Hazy MD, Dr. Elenore Paddy    Date: 07/09/22 Last Office Visit: 01/05/2022  Chief Complaint  Patient presents with   Hypertension   Follow-up    HPI  Laurie Wall is a 66 y.o. female whose past medical history and cardiovascular risk factors include: History of non-STEMI, history of stroke, heart failure with preserved EF, three-vessel coronary artery disease, hyperlipidemia, hypertension, OSA on CPAP, pulmonary hypertension, COPD with oxygen dependence 2 L, postmenopausal female, advanced age, obesity.  Patient was initially referred to the practice after moving to [facility] from [facility] for management of CAD, stroke, and history of non-STEMI.   In January 2021 when she presented to [facility] as a non-STEMI underwent angiography and was noted to have multivessel CAD.  Since she also had a stroke during the same hospitalization this shared decision was to treat her CAD medically with close follow-up.  She establish care with our practice in March 2022 and since then she has been uptitrated on GDMT.   She presents today for acute office visit for elevated blood pressure.  She does check her blood pressure at home and readings have been 180s/90s.  She does have trouble placing home blood pressure cuff from her arm and is concerned that it may not be the correct size. She denies chest pain, shortness of breath, palpitations, leg edema, orthopnea, PND, TIA/syncope.  FUNCTIONAL STATUS: No structured exercise program or daily routine.   ALLERGIES: Allergies  Allergen Reactions   Lamotrigine Hives   Ace Inhibitors Cough   Other Rash    Percodan    Oxycodone-Acetaminophen Rash    Other reaction(s): rash Other reaction(s): rash   Oxycodone-Aspirin Rash    Other reaction(s): rash     MEDICATION LIST PRIOR TO VISIT: Current Meds  Medication Sig   albuterol (VENTOLIN HFA) 108 (90 Base) MCG/ACT inhaler Inhale 2 puffs into the lungs every 6 (six) hours as needed for wheezing or shortness of breath.   aspirin 81 MG chewable tablet 1 tablet   carvedilol (COREG) 6.25 MG tablet TAKE 1 TABLET(6.25 MG) BY MOUTH TWICE DAILY WITH A MEAL   Cholecalciferol (VITAMIN D3) 50 MCG (2000 UT) capsule Take 2,000 Units by mouth daily.   dapagliflozin propanediol (FARXIGA) 10 MG TABS tablet Take 1 tablet (10 mg total) by mouth daily before breakfast.   divalproex (DEPAKOTE ER) 500 MG 24 hr tablet Take 1 tablet (500 mg total) by mouth at bedtime.   Ferrous Sulfate (IRON) 325 (65 Fe) MG TABS 1 tablet   gabapentin (NEURONTIN) 100 MG capsule Take 2 capsules (200 mg total) by mouth 2 (two) times daily. (Patient taking differently: Take 100 mg by mouth daily. 1 tablet daily and PRN)   isosorbide-hydrALAZINE (BIDIL) 20-37.5 MG tablet TAKE 1 TABLET BY MOUTH THREE TIMES DAILY   Misc. Devices (BARIATRIC ROLLATOR) MISC 1 application by Does not apply route daily.   Misc. Devices (COMMODE BEDSIDE) MISC 1 application by Does not apply route daily.   nitroGLYCERIN (NITROSTAT) 0.4 MG SL tablet Place 1 tablet (0.4 mg total) under the tongue every 5 (five) minutes as needed for chest pain. If you require more than two tablets five minutes apart go to the nearest ER via EMS.   omeprazole (PRILOSEC) 20 MG capsule 1 capsule   pyridOXINE (VITAMIN B-6) 100 MG tablet Take 100 mg by mouth  daily.   rosuvastatin (CRESTOR) 20 MG tablet Take 1 tablet (20 mg total) by mouth at bedtime.   sacubitril-valsartan (ENTRESTO) 97-103 MG Take 1 tablet by mouth 2 (two) times daily.   spironolactone (ALDACTONE) 50 MG tablet Take 1 tablet (50 mg total) by mouth daily.   TRELEGY ELLIPTA 100-62.5-25 MCG/ACT AEPB INHALE 1 PUFF INTO THE LUNGS DAILY   vitamin B-12 (CYANOCOBALAMIN) 500 MCG tablet Take 500 mcg by mouth daily.     PAST  MEDICAL HISTORY: Past Medical History:  Diagnosis Date   CHF (congestive heart failure) (HCC)    COPD (chronic obstructive pulmonary disease) (HCC)    Coronary artery disease    Hyperlipidemia    Hypertension    NSTEMI (non-ST elevated myocardial infarction) ([place])    OSA on CPAP    Oxygen dependent    Pulmonary hypertension (HCC)    Seizure ([place])    Stroke ([place])     PAST SURGICAL HISTORY: Past Surgical History:  Procedure Laterality Date   CARDIAC CATHETERIZATION     MASTECTOMY Bilateral    TUBAL LIGATION      FAMILY HISTORY: The patient family history includes Asthma in her sister; Heart attack in her father and mother; Hyperlipidemia in her sister and sister; Hypertension in her brother.  SOCIAL HISTORY:  The patient  reports that she quit smoking about 2 years ago. Her smoking use included cigarettes. She started smoking about 53 years ago. She has a 30.00 pack-year smoking history. She has never used smokeless tobacco. She reports that she does not drink alcohol and does not use drugs.  REVIEW OF SYSTEMS: Review of Systems  Constitutional: Negative for chills and fever.  Eyes:  Negative for discharge, double vision and pain.  Cardiovascular:  Negative for chest pain, claudication, dyspnea on exertion, leg swelling, near-syncope, orthopnea, palpitations, paroxysmal nocturnal dyspnea and syncope.  Respiratory:  Negative for hemoptysis and shortness of breath.   Musculoskeletal:  Negative for myalgias.  Gastrointestinal:  Negative for abdominal pain, constipation, diarrhea, hematemesis, hematochezia, melena, nausea and vomiting.  Neurological:  Positive for seizures. Negative for dizziness and light-headedness.       Speaking difficulties (expressive aphasia)    PHYSICAL EXAM:    07/09/2022    9:35 AM 07/09/2022    9:06 AM 04/28/2022    9:12 AM  Vitals with BMI  Height  _0  _1   Weight  251 lbs 251 lbs  BMI  74.1 28.7  Systolic 867 672 094  Diastolic 78 71 80   Pulse  65 65    CONSTITUTIONAL: Appears older than stated age, nasal cannula oxygen, hemodynamically stable, no acute distress.    SKIN: Skin is warm and dry. No rash noted. No cyanosis. No pallor. No jaundice HEAD: Normocephalic and atraumatic.  EYES: No scleral icterus MOUTH/THROAT: Moist oral membranes.  NECK: No JVD present. No thyromegaly noted. No carotid bruits  LYMPHATIC: No visible cervical adenopathy.  CHEST Normal respiratory effort. No intercostal retractions  LUNGS: Clear to auscultation bilaterally.  No stridor. No wheezes. No rales.  CARDIOVASCULAR: Regular, positive S1-S2, no murmurs rubs or gallops appreciated. ABDOMINAL: Obese, soft, nontender, nondistended, positive bowel sounds in all 4 quadrants, no apparent ascites.  EXTREMITIES: Trace bilateral peripheral edema, darker skin pigmentation to suggest chronic venous insufficiency, decreased bilateral DP and PT pulses. HEMATOLOGIC: No significant bruising NEUROLOGIC: Oriented to person, place, and time. Nonfocal. Normal muscle tone.  Walks with a walker. PSYCHIATRIC: Normal mood and affect. Normal behavior. Cooperative  CARDIAC DATABASE: EKG:  04/07/2022: Sinus bradycardia, 58 bpm, IVCD, nonspecific ST-T changes.  Echocardiogram: 12/30/2020: Left ventricle cavity is normal in size. Moderate concentric hypertrophy of the left ventricle. Normal global wall motion. Normal LV systolic function with EF 64%. Doppler evidence of grade II (pseudonormal) diastolic dysfunction, elevated LAP.  No significant valvular abnormality. Normal right atrial pressure.   Stress Testing: No results found for this or any previous visit from the past 1095 days.  Heart Catheterization: [facility] 10/04/2019: 1. Normal LV filling pressures with mean PCWP of 8 mm Hg and LVEDP of 11 mm Hg  2. Normal LV systolic function  3. No significant renal artery stenosis  4. Multivessel coronary artery disease including 80% proximal LAD stenosis,  60% mid-circumflex stenosis, 50% OM1 stenosis, 60% OM2 stenosis and 95% mid-RCA stenosis  5. Pulmonary hypertension with PA pressure of 56/19 mm Hg with a mean of 32 mm Hg   LABORATORY DATA:    Latest Ref Rng & Units 06/07/2021    5:44 PM  CBC  WBC 4.0 - 10.5 K/uL 7.7   Hemoglobin 12.0 - 15.0 g/dL 12.3   Hematocrit 36.0 - 46.0 % 39.5   Platelets 150 - 400 K/uL 277        Latest Ref Rng & Units 12/17/2021    1:42 PM 12/04/2021   11:21 AM 11/12/2021   10:07 AM  CMP  Glucose 70 - 99 mg/dL 74  86  102   BUN 8 - 27 mg/dL _0 Creatinine 0.57 - 1.00 mg/dL 0.96  0.92  0.89   Sodium 134 - 144 mmol/L 142  142  145   Potassium 3.5 - 5.2 mmol/L 4.4  4.2  3.8   Chloride 96 - 106 mmol/L 101  101  104   CO2 20 - 29 mmol/L _1 Calcium 8.7 - 10.3 mg/dL 9.3  9.2  9.3     Lipid Panel  No results found for: "CHOL", "TRIG", "HDL", "CHOLHDL", "VLDL", "LDLCALC", "LDLDIRECT", "LABVLDL"  No components found for: "NTPROBNP" Recent Labs    09/02/21 1239 09/21/21 1300 10/20/21 1118 11/12/21 1013 12/04/21 1121  PROBNP 205 119 199 307* 203   No results for input(s): "TSH" in the last 8760 hours.  BMP Recent Labs    11/12/21 1007 12/04/21 1121 12/17/21 1342  NA 145* 142 142  K 3.8 4.2 4.4  CL 104 101 101  CO2 30* 30* 29  GLUCOSE 102* 86 74  BUN _2 CREATININE 0.89 0.92 0.96  CALCIUM 9.3 9.2 9.3   No results found for: "CHOL", "HDL", "LDLCALC", "LDLDIRECT", "TRIG", "CHOLHDL"  HEMOGLOBIN A1C No results found for: "HGBA1C", "MPG"  External Labs: Collected: 11/13/2020 provided from care everywhere [facility] clinic) Hemoglobin 12.9 g/dL, hematocrit 40.5% NT proBNP: 194 Creatinine 1 mg/dL. eGFR: 62 mL/min per 1.73 m Sodium 141, potassium 3.5, chloride 97, bicarb 35, AST 16, ALT 18, alkaline phosphatase 127 Lipid profile: Total cholesterol 154, triglycerides 75, HDL 57, LDL 82.  Hemoglobin A1c: 6.1 TSH: 1.53  IMPRESSION:    ICD-10-CM   1. Benign  hypertension  I10        RECOMMENDATIONS: Laurie Wall is a 66 y.o. female whose past medical history and cardiac risk factors include: History of non-STEMI, history of stroke, heart failure with preserved EF, three-vessel coronary artery disease, hyperlipidemia, hypertension, OSA on CPAP, pulmonary hypertension, COPD with oxygen dependence 2 L, postmenopausal female, advanced age, obesity.  Benign hypertension Manual  recheck of blood pressure in office revealed well-controlled blood pressure.  Given that she is not having any symptoms to her blood pressure is well controlled at today's visit so that she may be receiving erroneous readings with home blood pressure monitor. We will not make any changes to blood pressure medications today. Advised patient to bring home blood pressure monitor to compare with in office readings. We did discuss the option of remote patient monitoring for blood pressure at this time she would like to hold off on this. Discussed importance of lifestyle modifications including low-sodium diet less than 2000 mg daily, weight loss, and exercise as tolerated.  FINAL MEDICATION LIST END OF ENCOUNTER: No orders of the defined types were placed in this encounter.  There are no discontinued medications.    Current Outpatient Medications:    albuterol (VENTOLIN HFA) 108 (90 Base) MCG/ACT inhaler, Inhale 2 puffs into the lungs every 6 (six) hours as needed for wheezing or shortness of breath., Disp: 8 g, Rfl: 6   aspirin 81 MG chewable tablet, 1 tablet, Disp: , Rfl:    carvedilol (COREG) 6.25 MG tablet, TAKE 1 TABLET(6.25 MG) BY MOUTH TWICE DAILY WITH A MEAL, Disp: 180 tablet, Rfl: 2   Cholecalciferol (VITAMIN D3) 50 MCG (2000 UT) capsule, Take 2,000 Units by mouth daily., Disp: , Rfl:    dapagliflozin propanediol (FARXIGA) 10 MG TABS tablet, Take 1 tablet (10 mg total) by mouth daily before breakfast., Disp: 90 tablet, Rfl: 3   divalproex (DEPAKOTE ER) 500 MG 24 hr  tablet, Take 1 tablet (500 mg total) by mouth at bedtime., Disp: 90 tablet, Rfl: 4   Ferrous Sulfate (IRON) 325 (65 Fe) MG TABS, 1 tablet, Disp: , Rfl:    gabapentin (NEURONTIN) 100 MG capsule, Take 2 capsules (200 mg total) by mouth 2 (two) times daily. (Patient taking differently: Take 100 mg by mouth daily. 1 tablet daily and PRN), Disp: 120 capsule, Rfl: 11   isosorbide-hydrALAZINE (BIDIL) 20-37.5 MG tablet, TAKE 1 TABLET BY MOUTH THREE TIMES DAILY, Disp: 270 tablet, Rfl: 3   Misc. Devices (BARIATRIC ROLLATOR) MISC, 1 application by Does not apply route daily., Disp: 1 each, Rfl: 0   Misc. Devices (COMMODE BEDSIDE) MISC, 1 application by Does not apply route daily., Disp: 1 each, Rfl: 0   nitroGLYCERIN (NITROSTAT) 0.4 MG SL tablet, Place 1 tablet (0.4 mg total) under the tongue every 5 (five) minutes as needed for chest pain. If you require more than two tablets five minutes apart go to the nearest ER via EMS., Disp: 30 tablet, Rfl: 0   omeprazole (PRILOSEC) 20 MG capsule, 1 capsule, Disp: , Rfl:    pyridOXINE (VITAMIN B-6) 100 MG tablet, Take 100 mg by mouth daily., Disp: , Rfl:    rosuvastatin (CRESTOR) 20 MG tablet, Take 1 tablet (20 mg total) by mouth at bedtime., Disp: 90 tablet, Rfl: 0   sacubitril-valsartan (ENTRESTO) 97-103 MG, Take 1 tablet by mouth 2 (two) times daily., Disp: 180 tablet, Rfl: 3   spironolactone (ALDACTONE) 50 MG tablet, Take 1 tablet (50 mg total) by mouth daily., Disp: 90 tablet, Rfl: 3   TRELEGY ELLIPTA 100-62.5-25 MCG/ACT AEPB, INHALE 1 PUFF INTO THE LUNGS DAILY, Disp: 60 each, Rfl: 2   vitamin B-12 (CYANOCOBALAMIN) 500 MCG tablet, Take 500 mcg by mouth daily., Disp: , Rfl:   No orders of the defined types were placed in this encounter.   There are no Patient Instructions on file for this visit.   --Continue  cardiac medications as reconciled in final medication list. --No follow-ups on file. Or sooner if needed. --Continue follow-up with your primary care  physician regarding the management of your other chronic comorbid conditions.  Patient's questions and concerns were addressed to her satisfaction. She voices understanding of the instructions provided during this encounter.   This note was created using a voice recognition software as a result there may be grammatical errors inadvertently enclosed that do not reflect the nature of this encounter. Every attempt is made to correct such errors.  Total time spent with patient was 45 minutes and greater than 50% of that time was spent in counseling and coordination care with the patient regarding complex decision making and discussion as state above.  Follow-up with Dr. Terri Skains at next scheduled visit.   Ernst Spell, [place] Pager: [phone] Office: [phone]

## 2022-07-16 ENCOUNTER — Ambulatory Visit: Payer: HMO

## 2022-07-16 VITALS — BP 196/75 | HR 56 | Resp 16 | Ht 62.0 in | Wt 255.2 lb

## 2022-07-16 DIAGNOSIS — I10 Essential (primary) hypertension: Secondary | ICD-10-CM | POA: Diagnosis not present

## 2022-07-16 DIAGNOSIS — R001 Bradycardia, unspecified: Secondary | ICD-10-CM

## 2022-07-16 DIAGNOSIS — I5032 Chronic diastolic (congestive) heart failure: Secondary | ICD-10-CM

## 2022-07-16 MED ORDER — FUROSEMIDE 20 MG PO TABS: 20.0000 mg | ORAL_TABLET | Freq: Every day | ORAL | 3 refills | Status: AC

## 2022-07-16 MED ORDER — NEBIVOLOL HCL 10 MG PO TABS: 10.0000 mg | ORAL_TABLET | Freq: Every day | ORAL | 3 refills | Status: AC

## 2022-07-16 NOTE — Progress Notes (Signed)
ID:  Laurie Wall, DOB August 12, 1956, MRN [id number]  PCP: Wenda Low, MD  Cardiologist:  Rex Kras, DO, [facility] (established care 11/18/2020) Former Cardiology Providers: Caren Hazy MD, Dr. Elenore Paddy    Date: 07/16/22 Last Office Visit: 01/05/2022  Chief Complaint  Patient presents with   Blood Pressure Check    HPI  Laurie Wall is a 66 y.o. female whose past medical history and cardiovascular risk factors include: History of non-STEMI, history of stroke, heart failure with preserved EF, three-vessel coronary artery disease, hyperlipidemia, hypertension, OSA on CPAP, pulmonary hypertension, COPD with oxygen dependence 2 L, postmenopausal female, advanced age, obesity.  Patient was initially referred to the practice after moving to [facility] from [facility] for management of CAD, stroke, and history of non-STEMI.   In January 2021 when she presented to [facility] as a non-STEMI underwent angiography and was noted to have multivessel CAD.  Since she also had a stroke during the same hospitalization this shared decision was to treat her CAD medically with close follow-up.  She establish care with our practice in March 2022 and since then she has been uptitrated on GDMT.   She presents today for home blood pressure monitor check. She was found to by hypertensive with blood pressure 196/75. She is experiencing some anxiety related to elevated blood pressure. She also admits to noncompliance with CPAP. She denies chest pain, shortness of breath, palpitations, leg edema, orthopnea, PND, TIA/syncope.  FUNCTIONAL STATUS: No structured exercise program or daily routine.   ALLERGIES: Allergies  Allergen Reactions   Lamotrigine Hives   Ace Inhibitors Cough   Other Rash    Percodan    Oxycodone-Acetaminophen Rash    Other reaction(s): rash Other reaction(s): rash   Oxycodone-Aspirin Rash    Other reaction(s): rash    MEDICATION LIST PRIOR TO VISIT: Current Meds   Medication Sig   albuterol (VENTOLIN HFA) 108 (90 Base) MCG/ACT inhaler Inhale 2 puffs into the lungs every 6 (six) hours as needed for wheezing or shortness of breath.   aspirin 81 MG chewable tablet 1 tablet   Cholecalciferol (VITAMIN D3) 50 MCG (2000 UT) capsule Take 2,000 Units by mouth daily.   dapagliflozin propanediol (FARXIGA) 10 MG TABS tablet Take 1 tablet (10 mg total) by mouth daily before breakfast.   divalproex (DEPAKOTE ER) 500 MG 24 hr tablet Take 1 tablet (500 mg total) by mouth at bedtime.   Ferrous Sulfate (IRON) 325 (65 Fe) MG TABS 1 tablet   furosemide (LASIX) 20 MG tablet Take 1 tablet (20 mg total) by mouth daily.   gabapentin (NEURONTIN) 100 MG capsule Take 2 capsules (200 mg total) by mouth 2 (two) times daily. (Patient taking differently: Take 100 mg by mouth daily. 1 tablet daily and PRN)   isosorbide-hydrALAZINE (BIDIL) 20-37.5 MG tablet TAKE 1 TABLET BY MOUTH THREE TIMES DAILY   Misc. Devices (BARIATRIC ROLLATOR) MISC 1 application by Does not apply route daily.   Misc. Devices (COMMODE BEDSIDE) MISC 1 application by Does not apply route daily.   nebivolol (BYSTOLIC) 10 MG tablet Take 1 tablet (10 mg total) by mouth daily.   nitroGLYCERIN (NITROSTAT) 0.4 MG SL tablet Place 1 tablet (0.4 mg total) under the tongue every 5 (five) minutes as needed for chest pain. If you require more than two tablets five minutes apart go to the nearest ER via EMS.   omeprazole (PRILOSEC) 20 MG capsule 1 capsule   pyridOXINE (VITAMIN B-6) 100 MG tablet Take 100 mg by mouth  daily.   rosuvastatin (CRESTOR) 20 MG tablet Take 1 tablet (20 mg total) by mouth at bedtime.   sacubitril-valsartan (ENTRESTO) 97-103 MG Take 1 tablet by mouth 2 (two) times daily.   spironolactone (ALDACTONE) 50 MG tablet Take 1 tablet (50 mg total) by mouth daily.   TRELEGY ELLIPTA 100-62.5-25 MCG/ACT AEPB INHALE 1 PUFF INTO THE LUNGS DAILY   vitamin B-12 (CYANOCOBALAMIN) 500 MCG tablet Take 500 mcg by mouth  daily.   [DISCONTINUED] carvedilol (COREG) 6.25 MG tablet TAKE 1 TABLET(6.25 MG) BY MOUTH TWICE DAILY WITH A MEAL     PAST MEDICAL HISTORY: Past Medical History:  Diagnosis Date   CHF (congestive heart failure) (HCC)    COPD (chronic obstructive pulmonary disease) (HCC)    Coronary artery disease    Hyperlipidemia    Hypertension    NSTEMI (non-ST elevated myocardial infarction) (HCC)    OSA on CPAP    Oxygen dependent    Pulmonary hypertension (HCC)    Seizure ([place])    Stroke ([place])     PAST SURGICAL HISTORY: Past Surgical History:  Procedure Laterality Date   CARDIAC CATHETERIZATION     MASTECTOMY Bilateral    TUBAL LIGATION      FAMILY HISTORY: The patient family history includes Asthma in her sister; Heart attack in her father and mother; Hyperlipidemia in her sister and sister; Hypertension in her brother.  SOCIAL HISTORY:  The patient  reports that she quit smoking about 2 years ago. Her smoking use included cigarettes. She started smoking about 53 years ago. She has a 30.00 pack-year smoking history. She has never used smokeless tobacco. She reports that she does not drink alcohol and does not use drugs.  REVIEW OF SYSTEMS: Review of Systems  Constitutional: Negative for chills and fever.  Eyes:  Negative for discharge, double vision and pain.  Cardiovascular:  Negative for chest pain, claudication, dyspnea on exertion, leg swelling, near-syncope, orthopnea, palpitations, paroxysmal nocturnal dyspnea and syncope.  Respiratory:  Negative for hemoptysis and shortness of breath.   Musculoskeletal:  Negative for myalgias.  Gastrointestinal:  Negative for abdominal pain, constipation, diarrhea, hematemesis, hematochezia, melena, nausea and vomiting.  Neurological:  Positive for seizures. Negative for dizziness and light-headedness.       Speaking difficulties (expressive aphasia)    PHYSICAL EXAM:    07/16/2022   11:07 AM 07/16/2022   10:43 AM 07/09/2022    9:35 AM   Vitals with BMI  Height  _0    Weight  255 lbs 3 oz   BMI  42.59   Systolic  563 875  Diastolic  75 78  Pulse 56 32     Physical Exam Cardiovascular:     Rate and Rhythm: Bradycardia present.     Heart sounds: Normal heart sounds. No murmur heard.    No gallop.  Pulmonary:     Effort: Pulmonary effort is normal.     Breath sounds: Normal breath sounds. No wheezing or rales.  Musculoskeletal:     Right lower leg: Edema present.     Left lower leg: Edema present.  Neurological:     Mental Status: She is alert.     CARDIAC DATABASE: EKG: 07/16/2022: Sinus bradycardia, 56 bpm, IVCD, nonspecific ST-T changes.  Echocardiogram: 12/30/2020: Left ventricle cavity is normal in size. Moderate concentric hypertrophy of the left ventricle. Normal global wall motion. Normal LV systolic function with EF 64%. Doppler evidence of grade II (pseudonormal) diastolic dysfunction, elevated LAP.  No significant valvular abnormality. Normal  right atrial pressure.   Stress Testing: No results found for this or any previous visit from the past 1095 days.  Heart Catheterization: [facility] 10/04/2019: 1. Normal LV filling pressures with mean PCWP of 8 mm Hg and LVEDP of 11 mm Hg  2. Normal LV systolic function  3. No significant renal artery stenosis  4. Multivessel coronary artery disease including 80% proximal LAD stenosis, 60% mid-circumflex stenosis, 50% OM1 stenosis, 60% OM2 stenosis and 95% mid-RCA stenosis  5. Pulmonary hypertension with PA pressure of 56/19 mm Hg with a mean of 32 mm Hg   LABORATORY DATA:    Latest Ref Rng & Units 06/07/2021    5:44 PM  CBC  WBC 4.0 - 10.5 K/uL 7.7   Hemoglobin 12.0 - 15.0 g/dL 12.3   Hematocrit 36.0 - 46.0 % 39.5   Platelets 150 - 400 K/uL 277        Latest Ref Rng & Units 12/17/2021    1:42 PM 12/04/2021   11:21 AM 11/12/2021   10:07 AM  CMP  Glucose 70 - 99 mg/dL 74  86  102   BUN 8 - 27 mg/dL _0 Creatinine 0.57 - 1.00  mg/dL 0.96  0.92  0.89   Sodium 134 - 144 mmol/L 142  142  145   Potassium 3.5 - 5.2 mmol/L 4.4  4.2  3.8   Chloride 96 - 106 mmol/L 101  101  104   CO2 20 - 29 mmol/L _1 Calcium 8.7 - 10.3 mg/dL 9.3  9.2  9.3     Lipid Panel  No results found for: "CHOL", "TRIG", "HDL", "CHOLHDL", "VLDL", "LDLCALC", "LDLDIRECT", "LABVLDL"  No components found for: "NTPROBNP" Recent Labs    09/02/21 1239 09/21/21 1300 10/20/21 1118 11/12/21 1013 12/04/21 1121  PROBNP 205 119 199 307* 203   No results for input(s): "TSH" in the last 8760 hours.  BMP Recent Labs    11/12/21 1007 12/04/21 1121 12/17/21 1342  NA 145* 142 142  K 3.8 4.2 4.4  CL 104 101 101  CO2 30* 30* 29  GLUCOSE 102* 86 74  BUN _2 CREATININE 0.89 0.92 0.96  CALCIUM 9.3 9.2 9.3   No results found for: "CHOL", "HDL", "LDLCALC", "LDLDIRECT", "TRIG", "CHOLHDL"  HEMOGLOBIN A1C No results found for: "HGBA1C", "MPG"  External Labs: Collected: 11/13/2020 provided from care everywhere [facility] clinic) Hemoglobin 12.9 g/dL, hematocrit 40.5% NT proBNP: 194 Creatinine 1 mg/dL. eGFR: 62 mL/min per 1.73 m Sodium 141, potassium 3.5, chloride 97, bicarb 35, AST 16, ALT 18, alkaline phosphatase 127 Lipid profile: Total cholesterol 154, triglycerides 75, HDL 57, LDL 82.  Hemoglobin A1c: 6.1 TSH: 1.53  IMPRESSION:    ICD-10-CM   1. Primary hypertension  I10 EKG 12-Lead    PCV RENAL/RENAL ARTERY DUPLEX COMPLETE    2. Chronic heart failure with preserved ejection fraction (HCC)  I50.32 Pro b natriuretic peptide (BNP)    Basic Metabolic Panel (BMET)    Pro b natriuretic peptide (BNP)    Basic Metabolic Panel (BMET)       RECOMMENDATIONS: Brighid Koch is a 66 y.o. female whose past medical history and cardiac risk factors include: History of non-STEMI, history of stroke, heart failure with preserved EF, three-vessel coronary artery disease, hyperlipidemia, hypertension, OSA on CPAP, pulmonary  hypertension, COPD with oxygen dependence 2 L, postmenopausal female, advanced age, obesity.  Primary hypertension Feel that elevated blood  pressure is related to your dietary intake including increased sodium consumption, weight gain, and noncompliance with CPAP. We will stop carvedilol and start Bystolic 10 mg daily and Lasix 20 mg daily. Check proBNP and BMP today and again in 10 days. We will check renal artery duplex to rule out secondary causes of hypertension. Discussed at importance of lifestyle modifications including low-sodium diet less than 1500 mg daily, weight loss, and exercise as tolerated. Discussed the importance of compliance with CPAP. Patient has been enrolled in RPM for blood pressure monitoring.  Chronic heart failure with preserved ejection fraction (HCC) She does have bilateral lower extremity edema today otherwise noted clinical evidence of acute heart failure.   FINAL MEDICATION LIST END OF ENCOUNTER: Meds ordered this encounter  Medications   nebivolol (BYSTOLIC) 10 MG tablet    Sig: Take 1 tablet (10 mg total) by mouth daily.    Dispense:  90 tablet    Refill:  3    Order Specific Question:   Supervising Provider    Answer:   Adrian Prows [[id number]]   furosemide (LASIX) 20 MG tablet    Sig: Take 1 tablet (20 mg total) by mouth daily.    Dispense:  90 tablet    Refill:  3    Order Specific Question:   Supervising Provider    Answer:   Adrian Prows [[id number]]   Medications Discontinued During This Encounter  Medication Reason   carvedilol (COREG) 6.25 MG tablet       Current Outpatient Medications:    albuterol (VENTOLIN HFA) 108 (90 Base) MCG/ACT inhaler, Inhale 2 puffs into the lungs every 6 (six) hours as needed for wheezing or shortness of breath., Disp: 8 g, Rfl: 6   aspirin 81 MG chewable tablet, 1 tablet, Disp: , Rfl:    Cholecalciferol (VITAMIN D3) 50 MCG (2000 UT) capsule, Take 2,000 Units by mouth daily., Disp: , Rfl:    dapagliflozin propanediol  (FARXIGA) 10 MG TABS tablet, Take 1 tablet (10 mg total) by mouth daily before breakfast., Disp: 90 tablet, Rfl: 3   divalproex (DEPAKOTE ER) 500 MG 24 hr tablet, Take 1 tablet (500 mg total) by mouth at bedtime., Disp: 90 tablet, Rfl: 4   Ferrous Sulfate (IRON) 325 (65 Fe) MG TABS, 1 tablet, Disp: , Rfl:    furosemide (LASIX) 20 MG tablet, Take 1 tablet (20 mg total) by mouth daily., Disp: 90 tablet, Rfl: 3   gabapentin (NEURONTIN) 100 MG capsule, Take 2 capsules (200 mg total) by mouth 2 (two) times daily. (Patient taking differently: Take 100 mg by mouth daily. 1 tablet daily and PRN), Disp: 120 capsule, Rfl: 11   isosorbide-hydrALAZINE (BIDIL) 20-37.5 MG tablet, TAKE 1 TABLET BY MOUTH THREE TIMES DAILY, Disp: 270 tablet, Rfl: 3   Misc. Devices (BARIATRIC ROLLATOR) MISC, 1 application by Does not apply route daily., Disp: 1 each, Rfl: 0   Misc. Devices (COMMODE BEDSIDE) MISC, 1 application by Does not apply route daily., Disp: 1 each, Rfl: 0   nebivolol (BYSTOLIC) 10 MG tablet, Take 1 tablet (10 mg total) by mouth daily., Disp: 90 tablet, Rfl: 3   nitroGLYCERIN (NITROSTAT) 0.4 MG SL tablet, Place 1 tablet (0.4 mg total) under the tongue every 5 (five) minutes as needed for chest pain. If you require more than two tablets five minutes apart go to the nearest ER via EMS., Disp: 30 tablet, Rfl: 0   omeprazole (PRILOSEC) 20 MG capsule, 1 capsule, Disp: , Rfl:    pyridOXINE (VITAMIN  B-6) 100 MG tablet, Take 100 mg by mouth daily., Disp: , Rfl:    rosuvastatin (CRESTOR) 20 MG tablet, Take 1 tablet (20 mg total) by mouth at bedtime., Disp: 90 tablet, Rfl: 0   sacubitril-valsartan (ENTRESTO) 97-103 MG, Take 1 tablet by mouth 2 (two) times daily., Disp: 180 tablet, Rfl: 3   spironolactone (ALDACTONE) 50 MG tablet, Take 1 tablet (50 mg total) by mouth daily., Disp: 90 tablet, Rfl: 3   TRELEGY ELLIPTA 100-62.5-25 MCG/ACT AEPB, INHALE 1 PUFF INTO THE LUNGS DAILY, Disp: 60 each, Rfl: 2   vitamin B-12  (CYANOCOBALAMIN) 500 MCG tablet, Take 500 mcg by mouth daily., Disp: , Rfl:   Orders Placed This Encounter  Procedures   Pro b natriuretic peptide (BNP)   Basic Metabolic Panel (BMET)   Pro b natriuretic peptide (BNP)   Basic Metabolic Panel (BMET)   EKG 12-Lead   PCV RENAL/RENAL ARTERY DUPLEX COMPLETE     There are no Patient Instructions on file for this visit.   --Continue cardiac medications as reconciled in final medication list. --Return in about 2 weeks (around 07/30/2022) for HTN. Or sooner if needed. --Continue follow-up with your primary care physician regarding the management of your other chronic comorbid conditions.  Patient's questions and concerns were addressed to her satisfaction. She voices understanding of the instructions provided during this encounter.   This note was created using a voice recognition software as a result there may be grammatical errors inadvertently enclosed that do not reflect the nature of this encounter. Every attempt is made to correct such errors.  Total time spent with patient was 60 minutes and greater than 50% of that time was spent in counseling and coordination care with the patient regarding complex decision making and discussion as state above.  Follow-up in 2 weeks for hypertension or sooner if needed.   Ernst Spell, [place] Pager: [phone] Office: [phone]

## 2022-07-17 LAB — BASIC METABOLIC PANEL
BUN/Creatinine Ratio: 14 (ref 12–28)
BUN: 13 mg/dL (ref 8–27)
CO2: 32 mmol/L — ABNORMAL HIGH (ref 20–29)
Calcium: 10 mg/dL (ref 8.7–10.3)
Chloride: 98 mmol/L (ref 96–106)
Creatinine, Ser: 0.9 mg/dL (ref 0.57–1.00)
Glucose: 76 mg/dL (ref 70–99)
Potassium: 4.4 mmol/L (ref 3.5–5.2)
Sodium: 143 mmol/L (ref 134–144)
eGFR: 70 mL/min/{1.73_m2} (ref >59)

## 2022-07-17 LAB — PRO B NATRIURETIC PEPTIDE: NT-Pro BNP: 592 pg/mL — ABNORMAL HIGH (ref 0–301)

## 2022-07-19 ENCOUNTER — Ambulatory Visit: Payer: HMO

## 2022-07-19 DIAGNOSIS — I10 Essential (primary) hypertension: Secondary | ICD-10-CM

## 2022-07-23 ENCOUNTER — Telehealth: Payer: Self-pay

## 2022-07-23 DIAGNOSIS — I5032 Chronic diastolic (congestive) heart failure: Secondary | ICD-10-CM

## 2022-07-23 MED ORDER — ISOSORB DINITRATE-HYDRALAZINE 20-37.5 MG PO TABS: 2.0000 | ORAL_TABLET | Freq: Three times a day (TID) | ORAL | 3 refills | Status: DC

## 2022-07-23 NOTE — Telephone Encounter (Signed)
Patient recently re-enrolled in RPM for BP management. Patient's BP has been elevated since being enrolled in program. Would you like to make changes to her regimen at this time?  BP readings: 07/22/2022 Thursday at 10:31 AM 165 / 81      07/21/2022 Wednesday at 09:49 AM 180 / 97      07/20/2022 Tuesday at 10:41 AM 154 / 89      07/19/2022 Monday at 09:06 AM 181 / 92      07/18/2022 Sunday at 08:41 AM 177 / 93      07/17/2022 Saturday at 10:01 AM 183 / 100      11 /06/2022 Friday at 12:04 PM 165 / 101  Thanks.

## 2022-07-23 NOTE — Telephone Encounter (Signed)
Per conversation with Brittany,NP - increasing BiDil to 2 tabs TID (from 1 tab TID). Patient aware of change

## 2022-07-27 ENCOUNTER — Ambulatory Visit: Payer: HMO

## 2022-07-27 ENCOUNTER — Other Ambulatory Visit: Payer: Self-pay

## 2022-07-27 DIAGNOSIS — I5032 Chronic diastolic (congestive) heart failure: Secondary | ICD-10-CM | POA: Diagnosis not present

## 2022-07-27 MED ORDER — ISOSORB DINITRATE-HYDRALAZINE 20-37.5 MG PO TABS: 2.0000 | ORAL_TABLET | Freq: Three times a day (TID) | ORAL | 3 refills | Status: AC

## 2022-07-27 NOTE — Progress Notes (Signed)
ID:  Laurie Wall, DOB [birth year], MRN [id number]  PCP: Wenda Low, MD  Cardiologist:  Rex Kras, DO, [facility] (established care 11/18/2020) Former Cardiology Providers: Caren Hazy MD, Dr. Elenore Paddy    Date: 07/27/22 Last Office Visit: 01/05/2022  Chief Complaint  Patient presents with   Follow-up    2 weeks   Hypertension   Congestive Heart Failure   Coronary Artery Disease   Hyperlipidemia    HPI  Laurie Wall is a 66 y.o. female whose past medical history and cardiovascular risk factors include: History of non-STEMI, history of stroke, heart failure with preserved EF, three-vessel coronary artery disease, hyperlipidemia, hypertension, OSA on CPAP, pulmonary hypertension, COPD with oxygen dependence 2 L, postmenopausal female, advanced age, obesity.  Patient was initially referred to the practice after moving to [facility] from [facility] for management of CAD, stroke, and history of non-STEMI.   In January 2021 when she presented to [facility] as a non-STEMI underwent angiography and was noted to have multivessel CAD.  Since she also had a stroke during the same hospitalization this shared decision was to treat her CAD medically with close follow-up.  She establish care with our practice in March 2022 and since then she has been uptitrated on GDMT.   She presents today for 2-week follow-up for hypertension.  At last visit carvedilol was stopped and she was started on Bystolic 10 mg daily and Lasix 20 mg daily.  She was also enrolled in remote patient monitoring for blood pressure support which noted ongoing elevations in blood pressure.  Therefore, BiDil was increased to 2 tablets 3 times daily.  She reports compliance with CPAP since last visit.  She also had renal artery duplex on 07/19/2022 to rule out secondary causes of ongoing hypertension, this revealed no evidence of renal artery occlusive disease in either renal artery.  She has also been working on  dietary changes including reducing her sodium intake.  FUNCTIONAL STATUS: No structured exercise program or daily routine.   ALLERGIES: Allergies  Allergen Reactions   Lamotrigine Hives   Ace Inhibitors Cough   Other Rash    Percodan    Oxycodone-Acetaminophen Rash    Other reaction(s): rash Other reaction(s): rash   Oxycodone-Aspirin Rash    Other reaction(s): rash    MEDICATION LIST PRIOR TO VISIT: Current Meds  Medication Sig   albuterol (VENTOLIN HFA) 108 (90 Base) MCG/ACT inhaler Inhale 2 puffs into the lungs every 6 (six) hours as needed for wheezing or shortness of breath.   aspirin 81 MG chewable tablet 1 tablet   Cholecalciferol (VITAMIN D3) 50 MCG (2000 UT) capsule Take 2,000 Units by mouth daily.   dapagliflozin propanediol (FARXIGA) 10 MG TABS tablet Take 1 tablet (10 mg total) by mouth daily before breakfast.   divalproex (DEPAKOTE ER) 500 MG 24 hr tablet Take 1 tablet (500 mg total) by mouth at bedtime.   Ferrous Sulfate (IRON) 325 (65 Fe) MG TABS 1 tablet   furosemide (LASIX) 20 MG tablet Take 1 tablet (20 mg total) by mouth daily.   gabapentin (NEURONTIN) 100 MG capsule Take 2 capsules (200 mg total) by mouth 2 (two) times daily. (Patient taking differently: Take 100 mg by mouth daily. 1 tablet daily and PRN)   Misc. Devices (BARIATRIC ROLLATOR) MISC 1 application by Does not apply route daily.   Misc. Devices (COMMODE BEDSIDE) MISC 1 application by Does not apply route daily.   nebivolol (BYSTOLIC) 10 MG tablet Take 1 tablet (10 mg total)  by mouth daily.   omeprazole (PRILOSEC) 20 MG capsule 1 capsule   pyridOXINE (VITAMIN B-6) 100 MG tablet Take 100 mg by mouth daily.   rosuvastatin (CRESTOR) 20 MG tablet Take 1 tablet (20 mg total) by mouth at bedtime.   sacubitril-valsartan (ENTRESTO) 97-103 MG Take 1 tablet by mouth 2 (two) times daily.   spironolactone (ALDACTONE) 50 MG tablet Take 1 tablet (50 mg total) by mouth daily.   TRELEGY ELLIPTA 100-62.5-25  MCG/ACT AEPB INHALE 1 PUFF INTO THE LUNGS DAILY   vitamin B-12 (CYANOCOBALAMIN) 500 MCG tablet Take 500 mcg by mouth daily.   [DISCONTINUED] isosorbide-hydrALAZINE (BIDIL) 20-37.5 MG tablet Take 2 tablets by mouth 3 (three) times daily.     PAST MEDICAL HISTORY: Past Medical History:  Diagnosis Date   CHF (congestive heart failure) (HCC)    COPD (chronic obstructive pulmonary disease) (HCC)    Coronary artery disease    Hyperlipidemia    Hypertension    NSTEMI (non-ST elevated myocardial infarction) ([place])    OSA on CPAP    Oxygen dependent    Pulmonary hypertension (HCC)    Seizure ([place])    Stroke ([place])     PAST SURGICAL HISTORY: Past Surgical History:  Procedure Laterality Date   CARDIAC CATHETERIZATION     MASTECTOMY Bilateral    TUBAL LIGATION      FAMILY HISTORY: The patient family history includes Asthma in her sister; Heart attack in her father and mother; Hyperlipidemia in her sister and sister; Hypertension in her brother.  SOCIAL HISTORY:  The patient  reports that she quit smoking about 2 years ago. Her smoking use included cigarettes. She started smoking about 53 years ago. She has a 30.00 pack-year smoking history. She has never used smokeless tobacco. She reports that she does not drink alcohol and does not use drugs.  REVIEW OF SYSTEMS: Review of Systems  Cardiovascular:  Negative for chest pain, claudication, dyspnea on exertion, leg swelling, near-syncope, orthopnea, palpitations, paroxysmal nocturnal dyspnea and syncope.  Respiratory:  Negative for shortness of breath.   Musculoskeletal:  Negative for myalgias.  Neurological:  Negative for dizziness and light-headedness.       Speaking difficulties (expressive aphasia)    PHYSICAL EXAM:    07/27/2022   11:08 AM 07/27/2022   11:04 AM 07/16/2022   11:07 AM  Vitals with BMI  Height  _0    Weight  244 lbs   BMI  29.24   Systolic 462 863   Diastolic 63 65   Pulse 48 43 56    Physical  Exam Cardiovascular:     Rate and Rhythm: Regular rhythm. Bradycardia present.     Heart sounds: Normal heart sounds. No murmur heard.    No gallop.  Pulmonary:     Effort: Pulmonary effort is normal.     Breath sounds: Normal breath sounds. No wheezing or rales.  Musculoskeletal:     Right lower leg: Edema (trace) present.     Left lower leg: Edema (trace) present.  Neurological:     Mental Status: She is alert.     CARDIAC DATABASE: EKG: 07/16/2022: Sinus bradycardia, 56 bpm, IVCD, nonspecific ST-T changes.  Echocardiogram: 12/30/2020: Left ventricle cavity is normal in size. Moderate concentric hypertrophy of the left ventricle. Normal global wall motion. Normal LV systolic function with EF 64%. Doppler evidence of grade II (pseudonormal) diastolic dysfunction, elevated LAP.  No significant valvular abnormality. Normal right atrial pressure.   Stress Testing: No results found for this or  any previous visit from the past 1095 days.  Heart Catheterization: [facility] 10/04/2019: 1. Normal LV filling pressures with mean PCWP of 8 mm Hg and LVEDP of 11 mm Hg  2. Normal LV systolic function  3. No significant renal artery stenosis  4. Multivessel coronary artery disease including 80% proximal LAD stenosis, 60% mid-circumflex stenosis, 50% OM1 stenosis, 60% OM2 stenosis and 95% mid-RCA stenosis  5. Pulmonary hypertension with PA pressure of 56/19 mm Hg with a mean of 32 mm Hg   Renal artery duplex  07/19/2022:  No evidence of renal artery occlusive disease in either renal artery.  Normal intrarenal vascular perfusion is noted in the left kidney. Mildly  elevated resistivity index right kidney suggests medico-renal disease.  Renal length is within normal limits for both kidneys.  Normal abdominal aorta flow velocities noted.   LABORATORY DATA:    Latest Ref Rng & Units 06/07/2021    5:44 PM  CBC  WBC 4.0 - 10.5 K/uL 7.7   Hemoglobin 12.0 - 15.0 g/dL 12.3   Hematocrit  36.0 - 46.0 % 39.5   Platelets 150 - 400 K/uL 277        Latest Ref Rng & Units 07/16/2022   12:36 PM 12/17/2021    1:42 PM 12/04/2021   11:21 AM  CMP  Glucose 70 - 99 mg/dL 76  74  86   BUN 8 - 27 mg/dL _0 Creatinine 0.57 - 1.00 mg/dL 0.90  0.96  0.92   Sodium 134 - 144 mmol/L 143  142  142   Potassium 3.5 - 5.2 mmol/L 4.4  4.4  4.2   Chloride 96 - 106 mmol/L 98  101  101   CO2 20 - 29 mmol/L 32  29  30   Calcium 8.7 - 10.3 mg/dL 10.0  9.3  9.2    No components found for: "NTPROBNP" Recent Labs    09/02/21 1239 09/21/21 1300 10/20/21 1118 11/12/21 1013 12/04/21 1121 07/16/22 1236  PROBNP 205 119 199 307* 203 592*   BMP Recent Labs    12/04/21 1121 12/17/21 1342 07/16/22 1236  NA 142 142 143  K 4.2 4.4 4.4  CL 101 101 98  CO2 30* 29 32*  GLUCOSE 86 74 76  BUN _1 CREATININE 0.92 0.96 0.90  CALCIUM 9.2 9.3 10.0    External Labs: Collected: 11/13/2020 provided from care everywhere [facility] clinic) Hemoglobin 12.9 g/dL, hematocrit 40.5% NT proBNP: 194 Creatinine 1 mg/dL. eGFR: 62 mL/min per 1.73 m Sodium 141, potassium 3.5, chloride 97, bicarb 35, AST 16, ALT 18, alkaline phosphatase 127 Lipid profile: Total cholesterol 154, triglycerides 75, HDL 57, LDL 82.  Hemoglobin A1c: 6.1 TSH: 1.53  IMPRESSION:    ICD-10-CM   1. Chronic heart failure with preserved ejection fraction (HCC)  I50.32 isosorbide-hydrALAZINE (BIDIL) 20-37.5 MG tablet       RECOMMENDATIONS: Tarea Skillman is a 66 y.o. female whose past medical history and cardiac risk factors include: History of non-STEMI, history of stroke, heart failure with preserved EF, three-vessel coronary artery disease, hyperlipidemia, hypertension, OSA on CPAP, pulmonary hypertension, COPD with oxygen dependence 2 L, postmenopausal female, advanced age, obesity.  Primary hypertension At last visit patient was enrolled in remote patient monitoring for blood pressure and changes to  medication including stopping carvedilol and starting Bystolic 10 mg daily as well as Lasix 20 mg daily. Labs following last visit showed slight elevation in proBNP and normal  kidney function and electrolytes. Blood pressures remained elevated following medication changes at last visit therefore BiDil was increased to 2 tablets 3 times a day. Patient has been checking her blood pressure prior to morning medications therefore feel this is the reason for current ongoing elevations in blood pressure. Advised patient to check blood pressure an hour to an hour and a half after medications. If blood pressure remains elevated will consider adding amlodipine. We will recheck proBNP and BMP today.  If proBNP has decreased we will stop Lasix and have her take this as needed. Discussed the importance of continued compliance with CPAP, low-sodium diet less than 1500 mg a day, and incorporating exercise as tolerated to assist with weight loss.  Renal artery duplex completed on 07/19/2022 did not show any evidence of renal artery occlusive disease in either renal artery.  Chronic heart failure with preserved ejection fraction (HCC) Bilateral lower extremity edema improved since this visit. We will continue Lasix 20 mg daily for now and follow-up after lab testing. No other clinical evidence of heart failure on physical exam.   FINAL MEDICATION LIST END OF ENCOUNTER: Meds ordered this encounter  Medications   isosorbide-hydrALAZINE (BIDIL) 20-37.5 MG tablet    Sig: Take 2 tablets by mouth 3 (three) times daily.    Dispense:  270 tablet    Refill:  3    Order Specific Question:   Supervising Provider    Answer:   Adrian Prows [[id number]]   Medications Discontinued During This Encounter  Medication Reason   isosorbide-hydrALAZINE (BIDIL) 20-37.5 MG tablet Reorder     Current Outpatient Medications:    albuterol (VENTOLIN HFA) 108 (90 Base) MCG/ACT inhaler, Inhale 2 puffs into the lungs every 6 (six) hours  as needed for wheezing or shortness of breath., Disp: 8 g, Rfl: 6   aspirin 81 MG chewable tablet, 1 tablet, Disp: , Rfl:    Cholecalciferol (VITAMIN D3) 50 MCG (2000 UT) capsule, Take 2,000 Units by mouth daily., Disp: , Rfl:    dapagliflozin propanediol (FARXIGA) 10 MG TABS tablet, Take 1 tablet (10 mg total) by mouth daily before breakfast., Disp: 90 tablet, Rfl: 3   divalproex (DEPAKOTE ER) 500 MG 24 hr tablet, Take 1 tablet (500 mg total) by mouth at bedtime., Disp: 90 tablet, Rfl: 4   Ferrous Sulfate (IRON) 325 (65 Fe) MG TABS, 1 tablet, Disp: , Rfl:    furosemide (LASIX) 20 MG tablet, Take 1 tablet (20 mg total) by mouth daily., Disp: 90 tablet, Rfl: 3   gabapentin (NEURONTIN) 100 MG capsule, Take 2 capsules (200 mg total) by mouth 2 (two) times daily. (Patient taking differently: Take 100 mg by mouth daily. 1 tablet daily and PRN), Disp: 120 capsule, Rfl: 11   Misc. Devices (BARIATRIC ROLLATOR) MISC, 1 application by Does not apply route daily., Disp: 1 each, Rfl: 0   Misc. Devices (COMMODE BEDSIDE) MISC, 1 application by Does not apply route daily., Disp: 1 each, Rfl: 0   nebivolol (BYSTOLIC) 10 MG tablet, Take 1 tablet (10 mg total) by mouth daily., Disp: 90 tablet, Rfl: 3   omeprazole (PRILOSEC) 20 MG capsule, 1 capsule, Disp: , Rfl:    pyridOXINE (VITAMIN B-6) 100 MG tablet, Take 100 mg by mouth daily., Disp: , Rfl:    rosuvastatin (CRESTOR) 20 MG tablet, Take 1 tablet (20 mg total) by mouth at bedtime., Disp: 90 tablet, Rfl: 0   sacubitril-valsartan (ENTRESTO) 97-103 MG, Take 1 tablet by mouth 2 (two) times daily., Disp: 180  tablet, Rfl: 3   spironolactone (ALDACTONE) 50 MG tablet, Take 1 tablet (50 mg total) by mouth daily., Disp: 90 tablet, Rfl: 3   TRELEGY ELLIPTA 100-62.5-25 MCG/ACT AEPB, INHALE 1 PUFF INTO THE LUNGS DAILY, Disp: 60 each, Rfl: 2   vitamin B-12 (CYANOCOBALAMIN) 500 MCG tablet, Take 500 mcg by mouth daily., Disp: , Rfl:    isosorbide-hydrALAZINE (BIDIL) 20-37.5 MG  tablet, Take 2 tablets by mouth 3 (three) times daily., Disp: 270 tablet, Rfl: 3   nitroGLYCERIN (NITROSTAT) 0.4 MG SL tablet, Place 1 tablet (0.4 mg total) under the tongue every 5 (five) minutes as needed for chest pain. If you require more than two tablets five minutes apart go to the nearest ER via EMS., Disp: 30 tablet, Rfl: 0  No orders of the defined types were placed in this encounter.   There are no Patient Instructions on file for this visit.   --Continue cardiac medications as reconciled in final medication list. --No follow-ups on file. Or sooner if needed. --Continue follow-up with your primary care physician regarding the management of your other chronic comorbid conditions.  Patient's questions and concerns were addressed to her satisfaction. She voices understanding of the instructions provided during this encounter.   This note was created using a voice recognition software as a result there may be grammatical errors inadvertently enclosed that do not reflect the nature of this encounter. Every attempt is made to correct such errors.   Follow-up at next scheduled office visit or sooner if needed.   Ernst Spell, [place] Pager: [phone] Office: [phone]

## 2022-07-28 ENCOUNTER — Ambulatory Visit: Payer: HMO

## 2022-07-28 LAB — BASIC METABOLIC PANEL
BUN/Creatinine Ratio: 14 (ref 12–28)
BUN: 14 mg/dL (ref 8–27)
CO2: 28 mmol/L (ref 20–29)
Calcium: 9.8 mg/dL (ref 8.7–10.3)
Chloride: 100 mmol/L (ref 96–106)
Creatinine, Ser: 1 mg/dL (ref 0.57–1.00)
Glucose: 86 mg/dL (ref 70–99)
Potassium: 3.9 mmol/L (ref 3.5–5.2)
Sodium: 144 mmol/L (ref 134–144)
eGFR: 62 mL/min/{1.73_m2} (ref >59)

## 2022-07-28 LAB — PRO B NATRIURETIC PEPTIDE: NT-Pro BNP: 510 pg/mL — ABNORMAL HIGH (ref 0–301)

## 2022-07-30 DIAGNOSIS — I611 Nontraumatic intracerebral hemorrhage in hemisphere, cortical: Secondary | ICD-10-CM | POA: Diagnosis not present

## 2022-07-30 DIAGNOSIS — I214 Non-ST elevation (NSTEMI) myocardial infarction: Secondary | ICD-10-CM | POA: Diagnosis not present

## 2022-07-30 DIAGNOSIS — I272 Pulmonary hypertension, unspecified: Secondary | ICD-10-CM | POA: Diagnosis not present

## 2022-07-30 DIAGNOSIS — G4733 Obstructive sleep apnea (adult) (pediatric): Secondary | ICD-10-CM | POA: Diagnosis not present

## 2022-07-30 DIAGNOSIS — J449 Chronic obstructive pulmonary disease, unspecified: Secondary | ICD-10-CM | POA: Diagnosis not present

## 2022-07-30 DIAGNOSIS — J9610 Chronic respiratory failure, unspecified whether with hypoxia or hypercapnia: Secondary | ICD-10-CM | POA: Diagnosis not present

## 2022-07-30 DIAGNOSIS — M6281 Muscle weakness (generalized): Secondary | ICD-10-CM | POA: Diagnosis not present

## 2022-08-03 ENCOUNTER — Other Ambulatory Visit: Payer: Self-pay | Admitting: Pulmonary Disease

## 2022-08-09 ENCOUNTER — Ambulatory Visit (INDEPENDENT_AMBULATORY_CARE_PROVIDER_SITE_OTHER): Payer: PPO | Admitting: Pulmonary Disease

## 2022-08-09 ENCOUNTER — Encounter: Payer: Self-pay | Admitting: Pulmonary Disease

## 2022-08-09 VITALS — BP 158/70 | HR 60 | Temp 98.9°F | Ht 62.0 in | Wt 260.0 lb

## 2022-08-09 DIAGNOSIS — G4733 Obstructive sleep apnea (adult) (pediatric): Secondary | ICD-10-CM

## 2022-08-09 NOTE — Progress Notes (Signed)
Laurie Wall    LQ:[id number]    [birth year]  Primary Care Physician:Husain, Denton Ar, MD  Referring Physician: Wenda Low, MD [facility] [employer] [address],  [place] [address]  Chief complaint:   In for follow-up for shortness of breath, obstructive sleep apnea Trying to get used to using CPAP on a nightly basis Still not using CPAP on a nightly basis  HPI:  Doing well since last visit  She states she is tries to put the CPAP on nightly but follows up for phase Compliance data only showing about 27% compliance  She does have a history of obstructive lung disease on bronchodilators, history of pulmonary hypertension  Recently followed up with cardiology and they reiterated that she should make sure she continues using the CPAP nightly  Occasional cough, nonproductive  Limited with activities of daily living, does use  Has been trying to stay more active History of obstructive sleep apnea -Try to get used to using CPAP on a nightly basis  History of pulmonary hypertension  History of coronary artery disease Multivessel coronary artery disease  History of CVA January 2021 History of seizures January 2021  Her sister is very involved in her care and helps her with activities and medications  She does have significant dysarthria -Uses oxygen regularly, on INOGEN  Relocated from [place], [place]  Quit smoking about 21 months ago, 30-pack-year smoking history  No pertinent occupational history, no pets, no recent travel  Outpatient Encounter Medications as of 08/09/2022  Medication Sig   albuterol (VENTOLIN HFA) 108 (90 Base) MCG/ACT inhaler Inhale 2 puffs into the lungs every 6 (six) hours as needed for wheezing or shortness of breath.   aspirin 81 MG chewable tablet 1 tablet   Cholecalciferol (VITAMIN D3) 50 MCG (2000 UT) capsule Take 2,000 Units by mouth daily.   dapagliflozin propanediol (FARXIGA) 10 MG TABS tablet Take 1 tablet (10  mg total) by mouth daily before breakfast.   divalproex (DEPAKOTE ER) 500 MG 24 hr tablet Take 1 tablet (500 mg total) by mouth at bedtime.   Ferrous Sulfate (IRON) 325 (65 Fe) MG TABS 1 tablet   furosemide (LASIX) 20 MG tablet Take 1 tablet (20 mg total) by mouth daily.   gabapentin (NEURONTIN) 100 MG capsule Take 2 capsules (200 mg total) by mouth 2 (two) times daily. (Patient taking differently: Take 100 mg by mouth daily. 1 tablet daily and PRN)   isosorbide-hydrALAZINE (BIDIL) 20-37.5 MG tablet Take 2 tablets by mouth 3 (three) times daily.   Misc. Devices (BARIATRIC ROLLATOR) MISC 1 application by Does not apply route daily.   Misc. Devices (COMMODE BEDSIDE) MISC 1 application by Does not apply route daily.   nebivolol (BYSTOLIC) 10 MG tablet Take 1 tablet (10 mg total) by mouth daily.   omeprazole (PRILOSEC) 20 MG capsule 1 capsule   pyridOXINE (VITAMIN B-6) 100 MG tablet Take 100 mg by mouth daily.   sacubitril-valsartan (ENTRESTO) 97-103 MG Take 1 tablet by mouth 2 (two) times daily.   spironolactone (ALDACTONE) 50 MG tablet Take 1 tablet (50 mg total) by mouth daily.   TRELEGY ELLIPTA 100-62.5-25 MCG/ACT AEPB INHALE 1 PUFF INTO THE LUNGS DAILY   vitamin B-12 (CYANOCOBALAMIN) 500 MCG tablet Take 500 mcg by mouth daily.   nitroGLYCERIN (NITROSTAT) 0.4 MG SL tablet Place 1 tablet (0.4 mg total) under the tongue every 5 (five) minutes as needed for chest pain. If you require more than two tablets  five minutes apart go to the nearest ER via EMS.   rosuvastatin (CRESTOR) 20 MG tablet Take 1 tablet (20 mg total) by mouth at bedtime.   [DISCONTINUED] sacubitril-valsartan (ENTRESTO) 49-51 MG Take 1 tablet by mouth 2 (two) times daily. (Patient taking differently: Take 2 tablets by mouth 2 (two) times daily.)   No facility-administered encounter medications on file as of 08/09/2022.    Allergies as of 08/09/2022 - Review Complete 08/09/2022  Allergen Reaction Noted   Lamotrigine Hives  01/30/2020   Ace inhibitors Cough 06/21/2019   Other Rash 06/07/2021   Oxycodone-acetaminophen Rash 06/21/2019   Oxycodone-aspirin Rash 06/21/2019    Past Medical History:  Diagnosis Date   CHF (congestive heart failure) (HCC)    COPD (chronic obstructive pulmonary disease) (HCC)    Coronary artery disease    Hyperlipidemia    Hypertension    NSTEMI (non-ST elevated myocardial infarction) (HCC)    OSA on CPAP    Oxygen dependent    Pulmonary hypertension (HCC)    Seizure (HCC)    Stroke ([place])     Past Surgical History:  Procedure Laterality Date   CARDIAC CATHETERIZATION     MASTECTOMY Bilateral    TUBAL LIGATION      Family History  Problem Relation Age of Onset   Heart attack Mother    Heart attack Father    Asthma Sister    Hyperlipidemia Sister    Hypertension Brother    Hyperlipidemia Sister     Social History   Socioeconomic History   Marital status: Single    Spouse name: Not on file   Number of children: 1   Years of education: Not on file   Highest education level: Not on file  Occupational History   Not on file  Tobacco Use   Smoking status: Former    Packs/day: 1.00    Years: 30.00    Total pack years: 30.00    Types: Cigarettes    Start date: 5    Quit date: 09/21/2019    Years since quitting: 2.8   Smokeless tobacco: Never  Vaping Use   Vaping Use: Never used  Substance and Sexual Activity   Alcohol use: Never   Drug use: Never   Sexual activity: Not on file  Other Topics Concern   Not on file  Social History Narrative   Lives alone   Right Handed   Drinks 1-2 cups caffeine daily    Social Determinants of Health   Financial Resource Strain: Not on file  Food Insecurity: Not on file  Transportation Needs: Not on file  Physical Activity: Not on file  Stress: Not on file  Social Connections: Not on file  Intimate Partner Violence: Not on file    Review of Systems  Constitutional:  Negative for fever.  Respiratory:   Positive for cough and shortness of breath.   Cardiovascular:  Negative for chest pain.  Psychiatric/Behavioral:  Positive for sleep disturbance.     Vitals:   08/09/22 1104  BP: (!) 158/70  Pulse: 60  Temp: 98.9 F (37.2 C)  SpO2: 93%      Physical Exam Constitutional:      Appearance: She is obese.  HENT:     Head: Normocephalic and atraumatic.     Mouth/Throat:     Mouth: Mucous membranes are moist.  Eyes:     General:        Right eye: No discharge.        Left  eye: No discharge.  Cardiovascular:     Rate and Rhythm: Normal rate and regular rhythm.     Pulses: Normal pulses.     Heart sounds: Normal heart sounds. No murmur heard. Pulmonary:     Effort: Pulmonary effort is normal. No respiratory distress.     Breath sounds: No stridor. No wheezing or rhonchi.  Musculoskeletal:     Cervical back: No rigidity or tenderness.  Neurological:     Mental Status: She is alert.  Psychiatric:        Mood and Affect: Mood normal.    Data Reviewed: Care everywhere visits reviewed  CPAP compliance only showing 27% compliance with CPAP with residual AHI of 1.7 -Appears to have significant leaks  Echocardiogram shows diastolic dysfunction shows, ejection fraction of 64%  Assessment:  Pulmonary hypertension -Continue current care  Obstructive sleep apnea -Encouraged to continue using CPAP on a nightly basis -May need optimization of the mask  Chronic obstructive pulmonary disease -Continue Trelegy -Continue albuterol as needed  Deconditioning -Regular exercises as tolerated  History of diastolic heart failure Coronary artery disease Continue lines of care  Plan/Recommendations: .  Continue CPAP on a nightly basis  .  Continue current inhalers  .  DME referral for CPAP supplies, new mask  .  Encouraged to call with significant concerns  .  Follow-up in 3 months  .  Graded exercise as tolerated    Virl Diamond MD [facility] 08/09/2022, 11:11 AM  CC: Georgann Housekeeper, MD

## 2022-08-09 NOTE — Patient Instructions (Signed)
DME referral for CPAP supplies  Continue using your CPAP on a nightly basis  Graded exercise as tolerated  Continue current inhalers  Call us with significant concerns  I will see you back in about 3 months

## 2022-08-11 DIAGNOSIS — I10 Essential (primary) hypertension: Secondary | ICD-10-CM | POA: Diagnosis not present

## 2022-08-13 ENCOUNTER — Telehealth: Payer: Self-pay

## 2022-08-13 DIAGNOSIS — I272 Pulmonary hypertension, unspecified: Secondary | ICD-10-CM

## 2022-08-13 DIAGNOSIS — I5032 Chronic diastolic (congestive) heart failure: Secondary | ICD-10-CM

## 2022-08-13 NOTE — Telephone Encounter (Signed)
Spoke with her caregiver advised her of Dr. Emelda Brothers instructions. She verbalized understanding.

## 2022-08-13 NOTE — Telephone Encounter (Signed)
Take an extra lasix today.  Starting tomorrow take Lasix 40mg  po qAM (STOP and go back to the original dose when back to her baseline weight).  Reduce salt in the diet. Order and release BMP, magnesium, and NT proBNP in 1 week  Laurie Wall [place], DO, [facility]

## 2022-08-13 NOTE — Telephone Encounter (Signed)
Kathie Rhodes RN from [employer] called to inform us that patient called stating that she has gained 2 lbs today and that her legs are swollen. Kathie Rhodes would like to know is there anything else she can do or take, patient already took Lasix 20mg . Please advise.

## 2022-08-14 DIAGNOSIS — I469 Cardiac arrest, cause unspecified: Secondary | ICD-10-CM | POA: Diagnosis not present

## 2022-08-16 ENCOUNTER — Telehealth: Payer: Self-pay | Admitting: Podiatry

## 2022-08-16 NOTE — Telephone Encounter (Signed)
Sister Called wanting you to know her Sister Dianely passed away. [name] stated they loved you as a Doctor and that you were so kind-spirited and enjoyed talking to you about [employer].

## 2022-08-17 ENCOUNTER — Telehealth: Payer: Self-pay | Admitting: Neurology

## 2022-08-17 NOTE — Telephone Encounter (Signed)
Pt's sister Laurie Wall notified [facility] pt passed away on 2022-09-08. Ms. Earnstine Regal said Want Dr. Terrace Arabia to know she was a joy to come to. Appreciate everyone there from front desk to the nurses. Thank you.   Gave Ms. Wall our condolences.

## 2022-08-20 ENCOUNTER — Ambulatory Visit: Payer: HMO | Admitting: Podiatry

## 2022-09-06 DEATH — deceased

## 2022-10-08 ENCOUNTER — Ambulatory Visit: Payer: HMO | Admitting: Cardiology

## 2023-01-28 IMAGING — CR DG CHEST 2V
2 series · 2 of 2 positions shown · non-contrast
Comparison: None.

CLINICAL DATA: Shortness of breath.

EXAM:
CHEST - 2 VIEW

[chest lat]
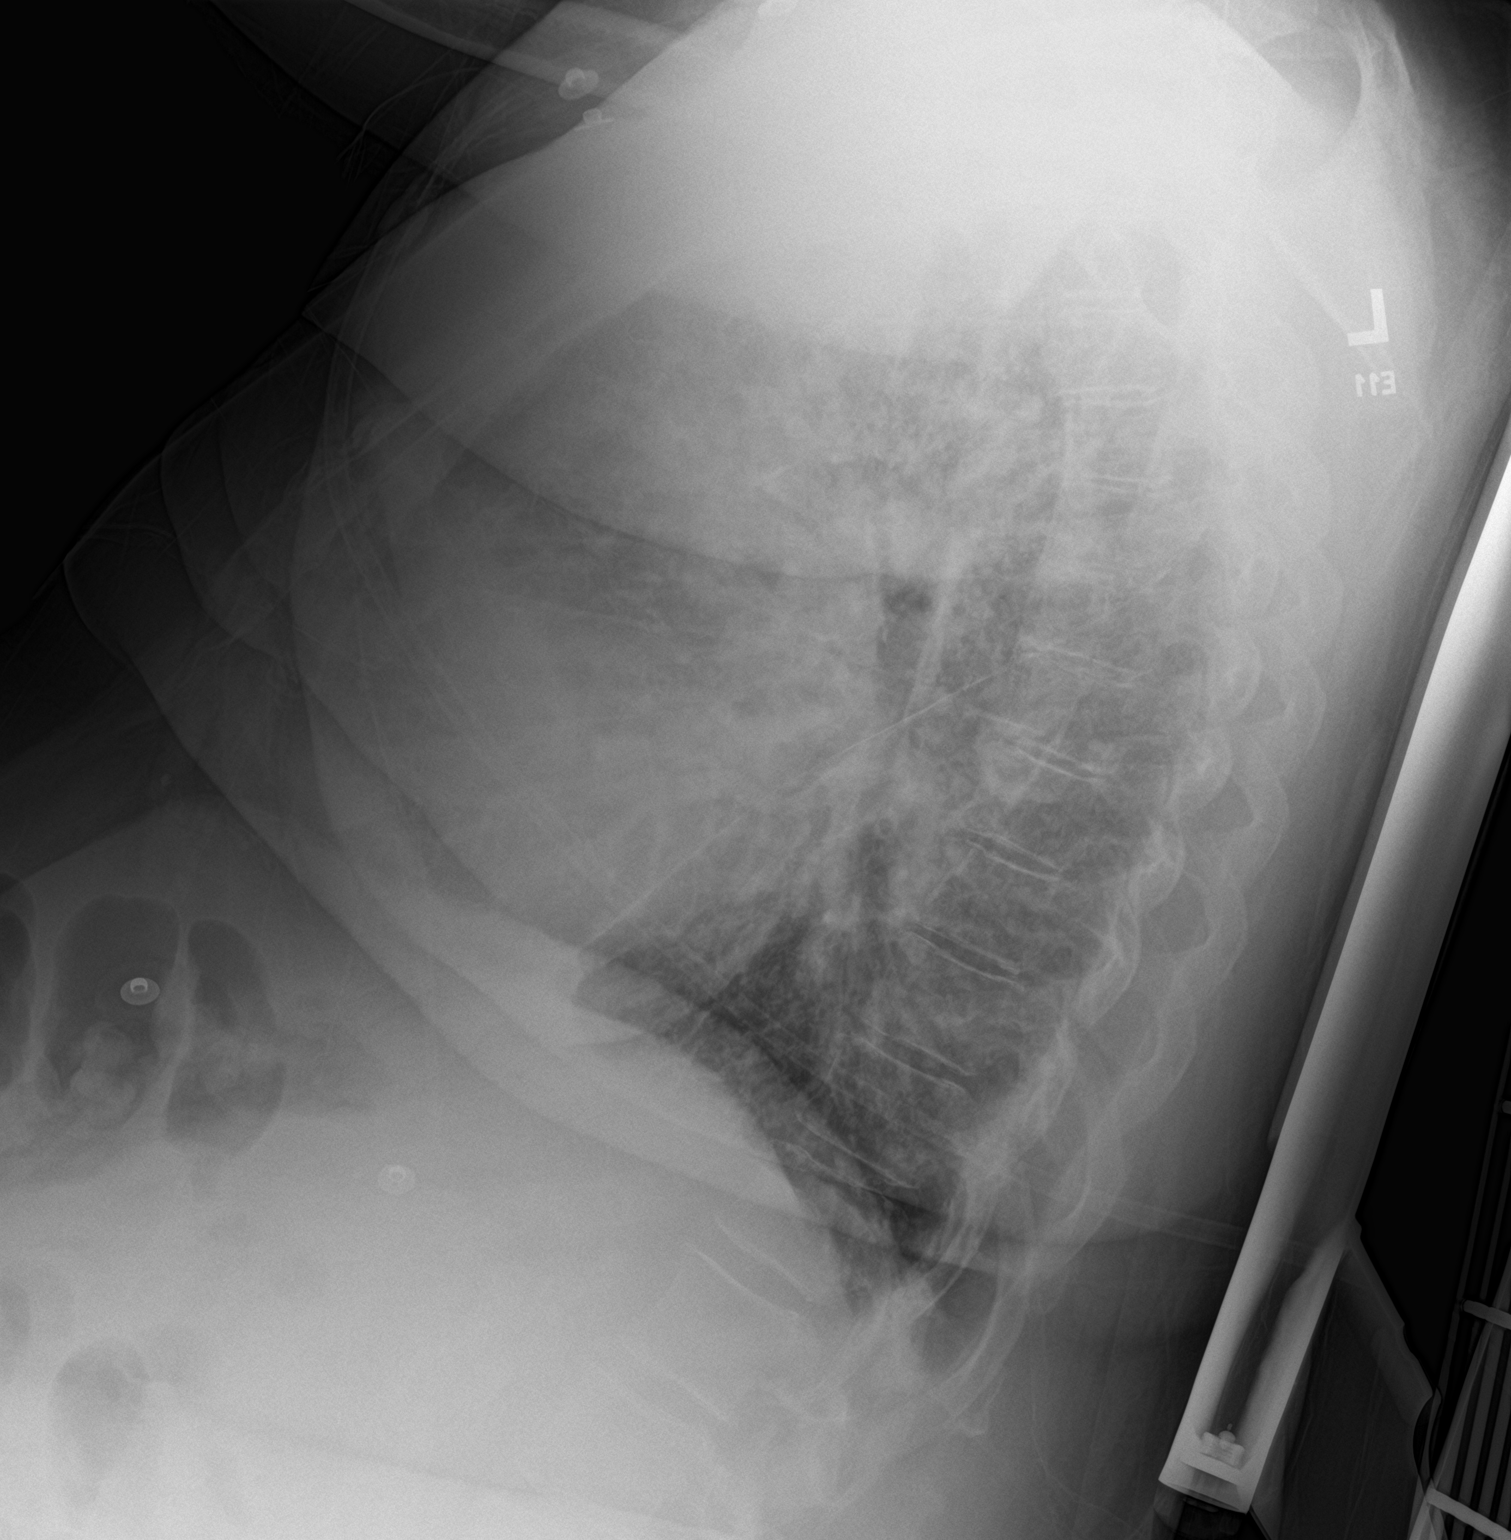

[chest ap]
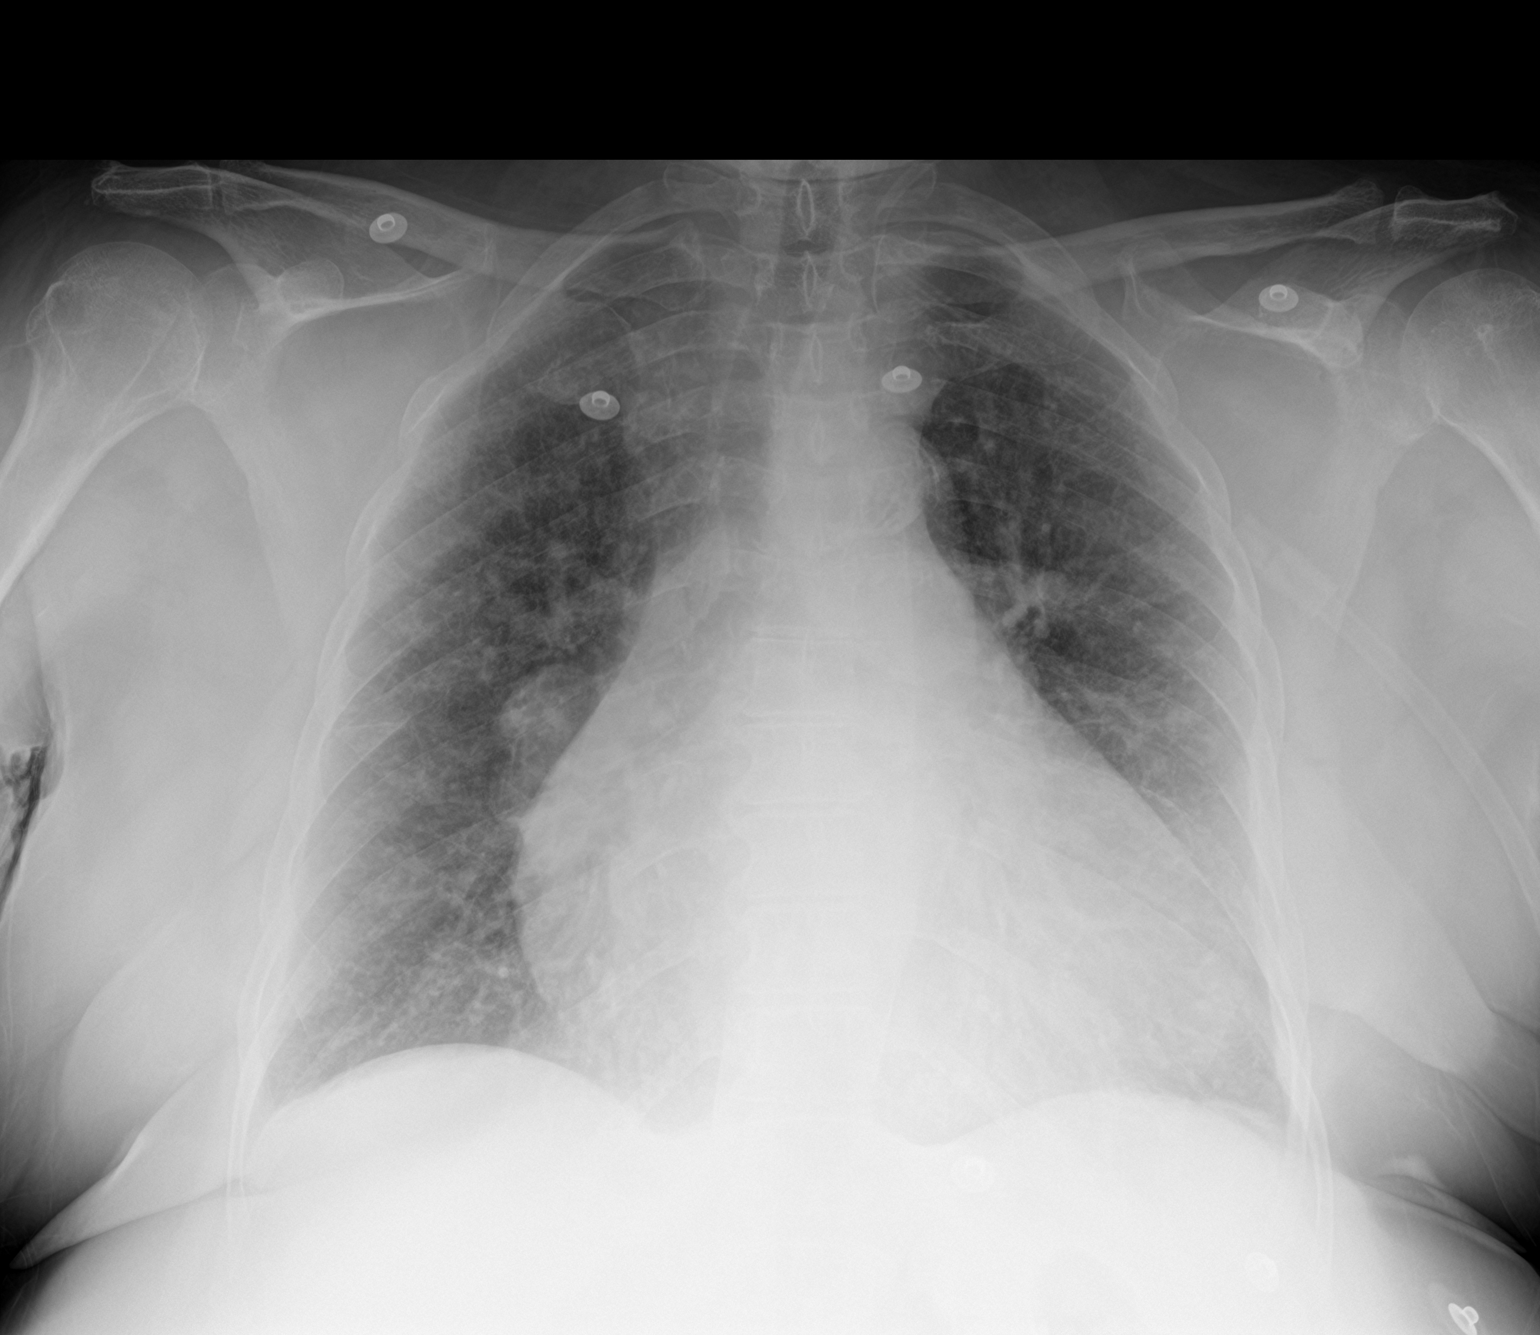

[2 of 2 positions shown; findings below may reference images not displayed]

FINDINGS: Enlarged cardiac contours. Contour abnormality in the region the
right mediastinum. Bilateral interstitial pulmonary opacities. No
pleural effusion. Osseous structures unremarkable.
IMPRESSION: Contour abnormality within the region of the right mediastinum
potentially dilated pulmonary artery. In the absence of priors,
recommend correlation with contrast-enhanced chest CT to exclude
hilar mass.

Cardiomegaly and mild interstitial opacities favored to represent
edema.

## 2023-04-21 ENCOUNTER — Ambulatory Visit: Payer: HMO | Admitting: Neurology
